# Patient Record
Sex: Female | Born: 1980 | Race: White | Hispanic: No | Marital: Married | Smoking: Former smoker
Health system: Southern US, Community
[De-identification: ages and names within clinical notes are randomized; demographics above are authoritative.]

## PROBLEM LIST (undated history)

## (undated) DIAGNOSIS — Z8619 Personal history of other infectious and parasitic diseases: Secondary | ICD-10-CM

## (undated) HISTORY — PX: LASER ABLATION OF THE CERVIX: SHX1949

---

## 1999-01-10 ENCOUNTER — Other Ambulatory Visit: Admission: RE | Admit: 1999-01-10 | Discharge: 1999-01-10 | Payer: Self-pay | Admitting: Obstetrics and Gynecology

## 2000-10-24 ENCOUNTER — Other Ambulatory Visit: Admission: RE | Admit: 2000-10-24 | Discharge: 2000-10-24 | Payer: Self-pay | Admitting: *Deleted

## 2001-05-07 ENCOUNTER — Emergency Department (HOSPITAL_COMMUNITY): Admission: EM | Admit: 2001-05-07 | Discharge: 2001-05-07 | Payer: Self-pay | Admitting: Podiatry

## 2001-05-07 ENCOUNTER — Encounter: Payer: Self-pay | Admitting: General Surgery

## 2002-11-20 ENCOUNTER — Other Ambulatory Visit: Admission: RE | Admit: 2002-11-20 | Discharge: 2002-11-20 | Payer: Self-pay | Admitting: Obstetrics and Gynecology

## 2003-12-02 ENCOUNTER — Other Ambulatory Visit: Admission: RE | Admit: 2003-12-02 | Discharge: 2003-12-02 | Payer: Self-pay | Admitting: Obstetrics and Gynecology

## 2004-02-22 ENCOUNTER — Ambulatory Visit (HOSPITAL_COMMUNITY): Admission: RE | Admit: 2004-02-22 | Discharge: 2004-02-22 | Payer: Self-pay | Admitting: Obstetrics and Gynecology

## 2004-02-22 ENCOUNTER — Encounter (INDEPENDENT_AMBULATORY_CARE_PROVIDER_SITE_OTHER): Payer: Self-pay | Admitting: Specialist

## 2007-03-06 ENCOUNTER — Ambulatory Visit: Payer: Self-pay | Admitting: Family Medicine

## 2008-07-07 ENCOUNTER — Ambulatory Visit: Payer: Self-pay | Admitting: Family Medicine

## 2009-11-07 ENCOUNTER — Ambulatory Visit: Payer: Self-pay | Admitting: Family Medicine

## 2011-03-23 NOTE — Op Note (Signed)
NAMEJUDYE, Katherine Bowman                            ACCOUNT NO.:  [id number]   MEDICAL RECORD NO.:  [id number]                   PATIENT TYPE:  AMB   LOCATION:  SDC                                  FACILITY:  [facility]   PHYSICIAN:  Sherry A. Rosalio Macadamia, M.D.           DATE OF BIRTH:  July 01, 1981   DATE OF PROCEDURE:  02/22/2004  DATE OF DISCHARGE:                                 OPERATIVE REPORT   PREOPERATIVE DIAGNOSIS:  Cervical dysplasia.   POSTOPERATIVE DIAGNOSIS:  Cervical dysplasia.   PROCEDURE:  Laser vaporization of cervix.   SURGEON:  Sherry A. Rosalio Macadamia, M.D.   ANESTHESIA:  MAC.   INDICATIONS:  This is a 30 year old G0, P0, woman who had an abnormal Pap  smear.  The patient underwent colposcopy and biopsy.  Biopsy revealed CIN-  II.  The abnormal areas were well out on the portio of the cervix;  therefore, the patient is brought to the operating room for laser  vaporization of cervix.   FINDINGS:  Cervical changes consistent with CIN-II.  No vulvar HPV.  Skin  tag on the right thigh.   PROCEDURE:  The patient was brought into the operating room and given  adequate IV sedation.  She was placed in the dorsal lithotomy position.  She  was draped with wet towels.  The vagina was washed with acetic acid.  Paracervical block was administered with 1% Nesacaine.  Using the colposcope  with the laser attached at 10 watts power, the abnormal areas were  identified and lasered circumferentially.  The cervix was divided into  quadrants.  The laser was increased to 40 watts power.  Each quadrant was  lasered at 40 watts power to a depth of 5-7 mm near the endocervical canal  and a depth of 3-4 mm on the portio.  This was done in each quadrant.  There  was no significant bleeding.  Using a defocused beam at 5 watts power, the  edges of the cervix were cauterized for a brush technique and the base was  cauterized in this fashion.  Monsel's solution was placed against the  cervix, adequate  hemostasis was present.  The external genitalia were then  observed after washing with acetic acid.  The colposcope was used.  There  were no HPV lesions seen, as had been seen in the office several weeks prior  to this procedure.  There was a probable skin tag on the right thigh.  This  was infiltrated with 1% Xylocaine with epinephrine and shave biopsy was  obtained.  Monsel's solution was placed on this shaved area and a Band-Aid  was placed.  The patient was taken out of the dorsal lithotomy position.  She was awakened.  She was moved from the operating table to a stretcher in  stable condition.   COMPLICATIONS:  None.   ESTIMATED BLOOD LOSS:  Less than 5 mL.   SPECIMENS:  Right thigh  skin tag.                                               Sherry A. Rosalio Macadamia, M.D.    SAD/MEDQ  D:  02/22/2004  T:  02/23/2004  Job:  [id number]

## 2011-03-23 NOTE — Consult Note (Signed)
[facility]  Patient:    Katherine Bowman, Katherine Bowman                         MRN: [id number] Proc. Date: 05/07/01 Adm. Date:  [id number] Disc. Date: [id number] Attending:  Carmelina Peal CC:         Dr. Vonita Moss, [facility]   Consultation Report  CHIEF COMPLAINT:  Vomiting and diarrhea.  HISTORY OF PRESENT ILLNESS:  This is a 30 year old white female who has had repeated bouts of vomiting and diarrhea today.  She has vomited at least 10 times and has had at least 10 loose watery stools without blood.  She denies abdominal pain.  She states she did have fever of 101 at home and did have some chills.  She has no prior gastrointestinal problems, according to her.  She went to [facility] with her mother and was thought to possibly have right lower quadrant tenderness and was sent to me to "rule out appendicitis."  When seen in the emergency room, she does not appear to be in any acute distress and denies that she is having any abdominal pain.  PAST MEDICAL HISTORY:  She denies having any surgery.  She denies any medical problems.  She was hospitalized at age 30 for a viral illness.  According to her mother, the patient has an alcohol abuse problem and has attempted to commit suicide once.  She also sees a Veterinary surgeon for her alcohol abuse.  The patient is very reluctant to talk about this.  CURRENT MEDICATIONS:  Birth control pills.  DRUG ALLERGIES:  None known.  SOCIAL HISTORY:  The patient lives with two roommates.  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  She is single and has no children.  She does smoke cigarettes and does admit to drinking alcohol.  FAMILY HISTORY:  Mother, age 26, father, age 29, both living and well.  One sister living and well.  One brother who has some type of bone disease.  REVIEW OF SYSTEMS:  Twelve-system review is negative except as described above.  PHYSICAL EXAMINATION:  GENERAL:   A thin, young white female in no distress.  VITAL SIGNS:  Temperature 98.3, pulse 88, respirations 20, blood pressure 153/78.  HEENT:  Sclerae clear.  Extraocular movements intact.  Oropharynx clear.  No lesions.  NECK:  Supple.  Nontender.  No adenopathy.  No thyromegaly.  No jugular venous distention.  LUNGS:  Clear to auscultation.  No CVA tenderness.  HEART:  Regular rate and rhythm.  No murmur.  BREASTS:  Not examined.  ABDOMEN:  Soft.  Scaphoid.  Active bowel sounds.  No tenderness, no mass, no hernia.  Basically, a completely benign exam.  EXTREMITIES:  No edema.  Good pulses.  NEUROLOGIC:  Grossly within normal limits.  DATA:  Urinalysis is negative except for specific gravity greater than 1.040. Basic metabolic panel is normal.  CBC shows a WBC of 8700 with 91 segs and 4 lymphs.  Hemoglobin 14.5.  Urine pregnancy test negative.  CT scan is completely normal.  There is no inflammatory process, no fluid collection.  Appendix is well-seen and filled with contrast.  Benign CT scan.  IMPRESSION: 1. Abdominal pain, suspect viral gastroenteritis. 2. History of alcohol abuse. 3. History of tobacco abuse.  PLAN:  The patient will be given IV fluid rehydration and she will be discharged home on a liquid diet.  She has been advised  to stop all alcohol intake and she has been advised to stop smoking.  She will return to the emergency room if she has any recurrent symptoms, otherwise, see me p.r.n. DD:  05/07/01 TD:  05/08/01 Job: [id number]

## 2011-05-14 ENCOUNTER — Other Ambulatory Visit: Payer: Self-pay | Admitting: Obstetrics and Gynecology

## 2012-03-02 ENCOUNTER — Encounter (HOSPITAL_COMMUNITY)
Admission: AD | Disposition: A | Discharge status: Home / Self Care | Payer: Self-pay | Source: Ambulatory Visit | Attending: Obstetrics and Gynecology

## 2012-03-02 ENCOUNTER — Encounter (HOSPITAL_COMMUNITY): Payer: Self-pay | Admitting: *Deleted

## 2012-03-02 ENCOUNTER — Inpatient Hospital Stay (HOSPITAL_COMMUNITY): Payer: BC Managed Care – PPO | Admitting: Anesthesiology

## 2012-03-02 ENCOUNTER — Inpatient Hospital Stay (HOSPITAL_COMMUNITY)
Admission: AD | Admit: 2012-03-02 | Discharge: 2012-03-05 | DRG: 371 | Disposition: A | Discharge status: Home / Self Care | Payer: BC Managed Care – PPO | Source: Ambulatory Visit | Attending: Obstetrics and Gynecology | Admitting: Obstetrics and Gynecology

## 2012-03-02 ENCOUNTER — Encounter (HOSPITAL_COMMUNITY): Payer: Self-pay | Admitting: Anesthesiology

## 2012-03-02 DIAGNOSIS — O30049 Twin pregnancy, dichorionic/diamniotic, unspecified trimester: Secondary | ICD-10-CM

## 2012-03-02 DIAGNOSIS — O30009 Twin pregnancy, unspecified number of placenta and unspecified number of amniotic sacs, unspecified trimester: Secondary | ICD-10-CM | POA: Diagnosis present

## 2012-03-02 DIAGNOSIS — O3090 Multiple gestation, unspecified, unspecified trimester: Principal | ICD-10-CM | POA: Diagnosis present

## 2012-03-02 HISTORY — DX: Personal history of other infectious and parasitic diseases: Z86.19

## 2012-03-02 LAB — CBC
HCT: 40.2 % (ref 36.0–46.0)
Hemoglobin: 13.4 g/dL (ref 12.0–15.0)
MCV: 92.2 fL (ref 78.0–100.0)
RDW: 13.7 % (ref 11.5–15.5)
WBC: 11.7 10*3/uL — ABNORMAL HIGH (ref 4.0–10.5)

## 2012-03-02 SURGERY — Surgical Case
Anesthesia: Spinal

## 2012-03-02 MED ORDER — CEFAZOLIN SODIUM 1-5 GM-% IV SOLN: 1.0000 g | INTRAVENOUS | Status: DC

## 2012-03-02 MED ORDER — CITRIC ACID-SODIUM CITRATE 334-500 MG/5ML PO SOLN
ORAL | Status: AC
  Administered 2012-03-02: 30 mL via ORAL
  Filled 2012-03-02: qty 15

## 2012-03-02 MED ORDER — FAMOTIDINE IN NACL 20-0.9 MG/50ML-% IV SOLN
INTRAVENOUS | Status: AC
  Filled 2012-03-02: qty 50

## 2012-03-02 MED ORDER — EPHEDRINE SULFATE 50 MG/ML IJ SOLN
INTRAMUSCULAR | Status: DC | PRN
  Administered 2012-03-02 (×3): 10 mg via INTRAVENOUS

## 2012-03-02 MED ORDER — FENTANYL CITRATE 0.05 MG/ML IJ SOLN
INTRAMUSCULAR | Status: DC | PRN
  Administered 2012-03-02: 25 ug via INTRATHECAL

## 2012-03-02 MED ORDER — MEPERIDINE HCL 25 MG/ML IJ SOLN: 6.2500 mg | INTRAMUSCULAR | Status: DC | PRN

## 2012-03-02 MED ORDER — FAMOTIDINE IN NACL 20-0.9 MG/50ML-% IV SOLN: 20.0000 mg | Freq: Once | INTRAVENOUS | Status: DC

## 2012-03-02 MED ORDER — KETOROLAC TROMETHAMINE 30 MG/ML IJ SOLN
INTRAMUSCULAR | Status: AC
  Administered 2012-03-02: 30 mg via INTRAVENOUS
  Filled 2012-03-02: qty 1

## 2012-03-02 MED ORDER — CEFAZOLIN SODIUM 1-5 GM-% IV SOLN
INTRAVENOUS | Status: AC
  Filled 2012-03-02: qty 50

## 2012-03-02 MED ORDER — ACETAMINOPHEN 325 MG PO TABS: 325.0000 mg | ORAL_TABLET | ORAL | Status: DC | PRN

## 2012-03-02 MED ORDER — OXYTOCIN 10 UNIT/ML IJ SOLN
20.0000 [IU] | INTRAVENOUS | Status: DC | PRN
  Administered 2012-03-02: 20 [IU] via INTRAVENOUS

## 2012-03-02 MED ORDER — CITRIC ACID-SODIUM CITRATE 334-500 MG/5ML PO SOLN
30.0000 mL | Freq: Once | ORAL | Status: AC
  Administered 2012-03-02: 30 mL via ORAL

## 2012-03-02 MED ORDER — LACTATED RINGERS IV BOLUS (SEPSIS): 1000.0000 mL | Freq: Once | INTRAVENOUS | Status: DC

## 2012-03-02 MED ORDER — MORPHINE SULFATE (PF) 0.5 MG/ML IJ SOLN
INTRAMUSCULAR | Status: DC | PRN
  Administered 2012-03-02: .1 mg via INTRATHECAL

## 2012-03-02 MED ORDER — ONDANSETRON HCL 4 MG/2ML IJ SOLN
INTRAMUSCULAR | Status: DC | PRN
  Administered 2012-03-02: 4 mg via INTRAVENOUS

## 2012-03-02 MED ORDER — MIDAZOLAM HCL 2 MG/2ML IJ SOLN: 0.5000 mg | Freq: Once | INTRAMUSCULAR | Status: DC | PRN

## 2012-03-02 MED ORDER — SCOPOLAMINE 1 MG/3DAYS TD PT72
MEDICATED_PATCH | TRANSDERMAL | Status: AC
  Filled 2012-03-02: qty 1

## 2012-03-02 MED ORDER — FENTANYL CITRATE 0.05 MG/ML IJ SOLN: 25.0000 ug | INTRAMUSCULAR | Status: DC | PRN

## 2012-03-02 MED ORDER — KETOROLAC TROMETHAMINE 30 MG/ML IJ SOLN: 30.0000 mg | Freq: Four times a day (QID) | INTRAMUSCULAR | Status: AC | PRN

## 2012-03-02 MED ORDER — PROMETHAZINE HCL 25 MG/ML IJ SOLN: 6.2500 mg | INTRAMUSCULAR | Status: DC | PRN

## 2012-03-02 MED ORDER — LACTATED RINGERS IV SOLN
INTRAVENOUS | Status: DC | PRN
  Administered 2012-03-02 (×2): via INTRAVENOUS

## 2012-03-02 MED ORDER — PHENYLEPHRINE HCL 10 MG/ML IJ SOLN
INTRAMUSCULAR | Status: DC | PRN
  Administered 2012-03-02: 80 ug via INTRAVENOUS
  Administered 2012-03-02: 120 ug via INTRAVENOUS

## 2012-03-02 MED ORDER — BUPIVACAINE IN DEXTROSE 0.75-8.25 % IT SOLN
INTRATHECAL | Status: DC | PRN
  Administered 2012-03-02: 1.5 mL via INTRATHECAL

## 2012-03-02 MED ORDER — SCOPOLAMINE 1 MG/3DAYS TD PT72
1.0000 | MEDICATED_PATCH | Freq: Once | TRANSDERMAL | Status: DC
  Administered 2012-03-02: 1.5 mg via TRANSDERMAL

## 2012-03-02 MED ORDER — BUPIVACAINE HCL (PF) 0.25 % IJ SOLN
INTRAMUSCULAR | Status: DC | PRN
  Administered 2012-03-02: 10 mL

## 2012-03-02 MED ORDER — KETOROLAC TROMETHAMINE 30 MG/ML IJ SOLN
30.0000 mg | Freq: Four times a day (QID) | INTRAMUSCULAR | Status: AC | PRN
  Administered 2012-03-02: 30 mg via INTRAVENOUS

## 2012-03-02 SURGICAL SUPPLY — 36 items
CLOTH BEACON ORANGE TIMEOUT ST (SAFETY) ×2 IMPLANT
CONTAINER PREFILL 10% NBF 15ML (MISCELLANEOUS) IMPLANT
DRESSING TELFA 8X3 (GAUZE/BANDAGES/DRESSINGS) IMPLANT
DRSG COVADERM 4X10 (GAUZE/BANDAGES/DRESSINGS) ×1 IMPLANT
ELECT REM PT RETURN 9FT ADLT (ELECTROSURGICAL) ×2
ELECTRODE REM PT RTRN 9FT ADLT (ELECTROSURGICAL) ×1 IMPLANT
EXTRACTOR VACUUM M CUP 4 TUBE (SUCTIONS) IMPLANT
GAUZE SPONGE 4X4 12PLY STRL LF (GAUZE/BANDAGES/DRESSINGS) ×4 IMPLANT
GLOVE BIO SURGEON STRL SZ 6.5 (GLOVE) ×1 IMPLANT
GLOVE BIO SURGEON STRL SZ7.5 (GLOVE) ×4 IMPLANT
GLOVE BIOGEL PI IND STRL 7.5 (GLOVE) IMPLANT
GLOVE BIOGEL PI INDICATOR 7.5 (GLOVE) ×2
GLOVE SS N UNI LF 7.5 STRL (GLOVE) ×1 IMPLANT
GLOVE SURG SS PI 7.5 STRL IVOR (GLOVE) ×1 IMPLANT
GOWN PREVENTION PLUS LG XLONG (DISPOSABLE) ×5 IMPLANT
GOWN PREVENTION PLUS XLARGE (GOWN DISPOSABLE) ×2 IMPLANT
KIT ABG SYR 3ML LUER SLIP (SYRINGE) IMPLANT
NDL HYPO 25X1 1.5 SAFETY (NEEDLE) ×1 IMPLANT
NDL HYPO 25X5/8 SAFETYGLIDE (NEEDLE) IMPLANT
NEEDLE HYPO 25X1 1.5 SAFETY (NEEDLE) ×2 IMPLANT
NEEDLE HYPO 25X5/8 SAFETYGLIDE (NEEDLE) ×2 IMPLANT
NS IRRIG 1000ML POUR BTL (IV SOLUTION) ×2 IMPLANT
PACK C SECTION WH (CUSTOM PROCEDURE TRAY) ×2 IMPLANT
PAD ABD 7.5X8 STRL (GAUZE/BANDAGES/DRESSINGS) IMPLANT
SLEEVE SCD COMPRESS KNEE MED (MISCELLANEOUS) ×1 IMPLANT
STAPLER VISISTAT 35W (STAPLE) ×2 IMPLANT
SUT MNCRL 0 VIOLET CTX 36 (SUTURE) ×2 IMPLANT
SUT MON AB 2-0 CT1 27 (SUTURE) ×2 IMPLANT
SUT MON AB-0 CT1 36 (SUTURE) ×4 IMPLANT
SUT MONOCRYL 0 CTX 36 (SUTURE) ×2
SUT PLAIN 0 NONE (SUTURE) IMPLANT
SUT PLAIN 2 0 XLH (SUTURE) IMPLANT
SYR CONTROL 10ML LL (SYRINGE) ×2 IMPLANT
TOWEL OR 17X24 6PK STRL BLUE (TOWEL DISPOSABLE) ×4 IMPLANT
TRAY FOLEY CATH 14FR (SET/KITS/TRAYS/PACK) ×2 IMPLANT
WATER STERILE IRR 1000ML POUR (IV SOLUTION) ×2 IMPLANT

## 2012-03-02 NOTE — Anesthesia Preprocedure Evaluation (Signed)
Anesthesia Evaluation  Patient identified by MRN, date of birth, ID band Patient awake    Reviewed: Allergy & Precautions, H&P , NPO status , Patient's Chart, lab work & pertinent test results  Airway Mallampati: II      Dental No notable dental hx.    Pulmonary neg pulmonary ROS,  breath sounds clear to auscultation  Pulmonary exam normal       Cardiovascular Exercise Tolerance: Good negative cardio ROS  Rhythm:regular Rate:Normal     Neuro/Psych negative neurological ROS  negative psych ROS   GI/Hepatic negative GI ROS, Neg liver ROS,   Endo/Other  negative endocrine ROS  Renal/GU negative Renal ROS  negative genitourinary   Musculoskeletal   Abdominal Normal abdominal exam  (+)   Peds  Hematology negative hematology ROS (+)   Anesthesia Other Findings   Reproductive/Obstetrics (+) Pregnancy                           Anesthesia Physical Anesthesia Plan  ASA: II and Emergent  Anesthesia Plan: Spinal   Post-op Pain Management:    Induction:   Airway Management Planned:   Additional Equipment:   Intra-op Plan:   Post-operative Plan:   Informed Consent: I have reviewed the patients History and Physical, chart, labs and discussed the procedure including the risks, benefits and alternatives for the proposed anesthesia with the patient or authorized representative who has indicated his/her understanding and acceptance.     Plan Discussed with: Anesthesiologist, CRNA and Surgeon  Anesthesia Plan Comments:         Anesthesia Quick Evaluation  

## 2012-03-02 NOTE — Anesthesia Procedure Notes (Addendum)
Spinal  Patient location during procedure: OR Start time: 03/02/2012 7:37 PM Staffing Anesthesiologist: Brayton Caves R Performed by: anesthesiologist  Preanesthetic Checklist Completed: patient identified, site marked, surgical consent, pre-op evaluation, timeout performed, IV checked, risks and benefits discussed and monitors and equipment checked Spinal Block Patient position: sitting Prep: DuraPrep Patient monitoring: heart rate, cardiac monitor, continuous pulse ox and blood pressure Approach: midline Location: L3-4 Injection technique: single-shot Needle Needle type: Sprotte  Needle gauge: 24 G Needle length: 9 cm Assessment Sensory level: T4 Additional Notes Patient identified.  Risk benefits discussed including failed block, incomplete pain control, headache, nerve damage, paralysis, blood pressure changes, nausea, vomiting, reactions to medication both toxic or allergic, and postpartum back pain.  Patient expressed understanding and wished to proceed.  All questions were answered.  Sterile technique used throughout procedure.  CSF was clear.  No parasthesia or other complications.  Please see nursing notes for vital signs.

## 2012-03-02 NOTE — H&P (Signed)
Katherine Bowman, Katherine Bowman                ACCOUNT NO.:  [id number]  MEDICAL RECORD NO.:  [id number]  LOCATION:  WU98                          FACILITY:  [facility]  PHYSICIAN:  Lenoard Aden, M.D.DATE OF BIRTH:  09/16/1981  DATE OF ADMISSION:  03/02/2012 DATE OF DISCHARGE:                             HISTORY & PHYSICAL   CHIEF COMPLAINT:  Spontaneous rupture of membranes.  HISTORY OF PRESENT ILLNESS:  She is a 31 year old white female, G1, P0, at 33-5/7th weeks' gestation who presents with spontaneous rupture of membranes, malpresentation of twin B, and advanced dilatation for delivery.  She has a medical history remarkable for no known drug allergies.  Medications are prenatal vitamins.  She is a nonsmoker, nondrinker.  She denies domestic or physical violence.  She has a previous history of an abnormal Pap smear with a history of a LEEP.  She has a family history of hypertension, mental retardation.  There are no previous pregnancies.  She has a history of laser surgery to her cervix.  Prenatal course was complicated by advanced preterm cervical change and malpresentation of B.  The patient is status post betamethasone.  PHYSICAL EXAMINATION:  GENERAL:  She is a well-developed, well- nourished, white female, in no acute distress. HEENT:  Normal. NECK:  Supple.  Full range of motion. LUNGS:  Clear. HEART:  Regular, ABDOMEN:  Soft, gravid, nontender. BACK:  No CVA tenderness. EXTREMITIES:  No cords. NEUROLOGIC:  Nonfocal. SKIN:  Intact.  IMPRESSION: 1. A 33-5/7th week twin dichorionic diamniotic gestation with     spontaneous rupture of membranes, Twin A. 2. Advanced dilatation with progressive cervical dilatation to 6-7 cm     noted. 3. Malpresentation of twin B.  The patient declines trial of labor     with possibilities for risks of twin B.  Plan is to proceed with primary low segment transverse cesarean section. Risks of anesthesia, infection, bleeding, injury to  abdominal organs, need for repair were discussed.  Delayed versus immediate complications include bowel, bladder injury were noted.  The patient acknowledges and wishes to proceed.  OR was notified, NICU notified.     Lenoard Aden, M.D.     RJT/MEDQ  D:  03/02/2012  T:  03/02/2012  Job:  [id number]

## 2012-03-02 NOTE — MAU Provider Note (Signed)
  History   SROM ago  CSN: [id number]  Arrival date and time: 03/02/12 4098   First Provider Initiated Contact with Patient 03/02/12 1903      Chief Complaint  Patient presents with  . Rupture of Membranes   HPI dictated OB History    Grav Para Term Preterm Abortions TAB SAB Ect Mult Living   1               Past Medical History  Diagnosis Date  . No pertinent past medical history     Past Surgical History  Procedure Date  . No past surgeries     No family history on file.  History  Substance Use Topics  . Smoking status: Not on file  . Smokeless tobacco: Not on file  . Alcohol Use: Not on file    Allergies: No Known Allergies  No prescriptions prior to admission    ROS Physical Exam dictated   There were no vitals taken for this visit.  Physical Exam  MAU Course  Procedures  MDM na  Assessment and Plan  33 5/7 weeks DIDI twins with SROM and advanced dilatation and malpresentation B. Proceed with Cesarean section Consent signed and risks vs benefits discussed.  Davionne Mastrangelo J 03/02/2012, 7:11 PM

## 2012-03-02 NOTE — Anesthesia Postprocedure Evaluation (Signed)
Anesthesia Post Note  Patient: Katherine Bowman  Procedure(s) Performed: Procedure(s) (LRB): CESAREAN SECTION (N/A)  Anesthesia type: Spinal  Patient location: PACU  Post pain: Pain level controlled  Post assessment: Post-op Vital signs reviewed  Last Vitals:  Filed Vitals:   03/02/12 2115  BP: 142/78  Pulse: 65  Temp: 36.3 C  Resp: 16    Post vital signs: Reviewed  Level of consciousness: awake  Complications: No apparent anesthesia complications

## 2012-03-02 NOTE — MAU Note (Signed)
Pt reports ROM @ 1830

## 2012-03-02 NOTE — Transfer of Care (Signed)
Immediate Anesthesia Transfer of Care Note  Patient: Katherine Bowman  Procedure(s) Performed: Procedure(s) (LRB): CESAREAN SECTION (N/A)  Patient Location: PACU  Anesthesia Type: Spinal  Level of Consciousness: awake, alert  and oriented  Airway & Oxygen Therapy: Patient Spontanous Breathing  Post-op Assessment: Report given to PACU RN and Post -op Vital signs reviewed and stable  Post vital signs: stable  Complications: No apparent anesthesia complications

## 2012-03-02 NOTE — Op Note (Signed)
Cesarean Section Procedure Note  Indications: DIDI twins with advanced dilatation, SROM, and malpresentation of Twin B.  Pre-operative Diagnosis: 33 week 5 day pregnancy.  Post-operative Diagnosis: same  Surgeon: Lenoard Aden   Assistants: Seymour Bars  Anesthesia: Local anesthesia 0.25.% bupivacaine and General Endotracheal with Epidural Regional  ASA Class: 2  Procedure Details  The patient was seen in the Holding Room. The risks, benefits, complications, treatment options, and expected outcomes were discussed with the patient.  The patient concurred with the proposed plan, giving informed consent. The risks of anesthesia, infection, bleeding and possible injury to other organs discussed. Injury to bowel, bladder, or ureter with possible need for repair discussed. Possible need for transfusion with secondary risks of hepatitis or HIV acquisition discussed. Post operative complications to include but not limited to DVT, PE and Pneumonia noted. The site of surgery properly noted/marked. The patient was taken to Operating Room # 2, identified as Katherine Bowman and the procedure verified as C-Section Delivery. A Time Out was held and the above information confirmed.  After induction of anesthesia, the patient was draped and prepped in the usual sterile manner. A Pfannenstiel incision was made and carried down through the subcutaneous tissue to the fascia. Fascial incision was made and extended transversely using Mayo scissors. The fascia was separated from the underlying rectus tissue superiorly and inferiorly. The peritoneum was identified and entered. Peritoneal incision was extended longitudinally. The utero-vesical peritoneal reflection was incised transversely and the bladder flap was bluntly freed from the lower uterine segment. A low transverse uterine incision(Kerr hysterotomy) was made. Delivered from vtx presentation was a  Female Twin A with Apgar scores of 8 at one minute and 10 at five  minutes. AROM clear for twin B converted to VTX position from transverse back up lie and delivered with apgars 8 and 9.  Bulb suctioning gently performed on both twins. Neonatal team in attendance.After the umbilical cords were clamped and cut, cord blood was obtained for evaluation. The placentas were removed intact and appeared normal. The uterus was curetted with a dry lap pack. Good hemostasis was noted.The uterine outline, tubes and ovaries appeared normal. The uterine incision was closed with running locked sutures of 0 Monocryl x 2 layers. Hemostasis was observed. Lavage was carried out until clear.The parietal peritoneum was closed with a running 2-0 Monocryl suture. The fascia was then reapproximated with running sutures of 0 Monocryl. The skin was reapproximated with staples.  Instrument, sponge, and needle counts were correct prior the abdominal closure and at the conclusion of the case.   Findings: Placenta x 2 to path  Estimated Blood Loss:  600         Drains: Foley                 Specimens: above3                 Complications:  None; patient tolerated the procedure well.         Disposition: PACU - hemodynamically stable.         Condition: stable  Attending Attestation: I performed the procedure.

## 2012-03-03 ENCOUNTER — Encounter (HOSPITAL_COMMUNITY): Payer: Self-pay

## 2012-03-03 LAB — CBC
MCH: 31 pg (ref 26.0–34.0)
MCV: 93 fL (ref 78.0–100.0)
Platelets: 144 10*3/uL — ABNORMAL LOW (ref 150–400)
RBC: 3.74 MIL/uL — ABNORMAL LOW (ref 3.87–5.11)
RDW: 13.5 % (ref 11.5–15.5)
WBC: 14.2 10*3/uL — ABNORMAL HIGH (ref 4.0–10.5)

## 2012-03-03 LAB — ABO/RH: ABO/RH(D): O POS

## 2012-03-03 MED ORDER — PRENATAL MULTIVITAMIN CH
1.0000 | ORAL_TABLET | Freq: Every day | ORAL | Status: DC
  Administered 2012-03-03 – 2012-03-05 (×3): 1 via ORAL
  Filled 2012-03-03 (×3): qty 1

## 2012-03-03 MED ORDER — TETANUS-DIPHTH-ACELL PERTUSSIS 5-2.5-18.5 LF-MCG/0.5 IM SUSP
0.5000 mL | Freq: Once | INTRAMUSCULAR | Status: AC
  Administered 2012-03-03: 0.5 mL via INTRAMUSCULAR
  Filled 2012-03-03: qty 0.5

## 2012-03-03 MED ORDER — SIMETHICONE 80 MG PO CHEW
80.0000 mg | CHEWABLE_TABLET | Freq: Three times a day (TID) | ORAL | Status: DC
  Administered 2012-03-03 – 2012-03-04 (×5): 80 mg via ORAL

## 2012-03-03 MED ORDER — NALBUPHINE HCL 10 MG/ML IJ SOLN
5.0000 mg | INTRAMUSCULAR | Status: DC | PRN
  Filled 2012-03-03: qty 1

## 2012-03-03 MED ORDER — DIBUCAINE 1 % RE OINT: 1.0000 "application " | TOPICAL_OINTMENT | RECTAL | Status: DC | PRN

## 2012-03-03 MED ORDER — NALOXONE HCL 0.4 MG/ML IJ SOLN: 0.4000 mg | INTRAMUSCULAR | Status: DC | PRN

## 2012-03-03 MED ORDER — MENTHOL 3 MG MT LOZG: 1.0000 | LOZENGE | OROMUCOSAL | Status: DC | PRN

## 2012-03-03 MED ORDER — ONDANSETRON HCL 4 MG/2ML IJ SOLN: 4.0000 mg | Freq: Three times a day (TID) | INTRAMUSCULAR | Status: DC | PRN

## 2012-03-03 MED ORDER — ACETAMINOPHEN 10 MG/ML IV SOLN
1000.0000 mg | Freq: Four times a day (QID) | INTRAVENOUS | Status: AC | PRN
  Filled 2012-03-03: qty 100

## 2012-03-03 MED ORDER — IBUPROFEN 600 MG PO TABS
600.0000 mg | ORAL_TABLET | Freq: Four times a day (QID) | ORAL | Status: DC
  Administered 2012-03-03 – 2012-03-05 (×9): 600 mg via ORAL
  Filled 2012-03-03 (×10): qty 1

## 2012-03-03 MED ORDER — ONDANSETRON HCL 4 MG PO TABS: 4.0000 mg | ORAL_TABLET | ORAL | Status: DC | PRN

## 2012-03-03 MED ORDER — DIPHENHYDRAMINE HCL 25 MG PO CAPS: 25.0000 mg | ORAL_CAPSULE | ORAL | Status: DC | PRN

## 2012-03-03 MED ORDER — METOCLOPRAMIDE HCL 5 MG/ML IJ SOLN: 10.0000 mg | Freq: Three times a day (TID) | INTRAMUSCULAR | Status: DC | PRN

## 2012-03-03 MED ORDER — DIPHENHYDRAMINE HCL 50 MG/ML IJ SOLN: 25.0000 mg | INTRAMUSCULAR | Status: DC | PRN

## 2012-03-03 MED ORDER — METHYLERGONOVINE MALEATE 0.2 MG/ML IJ SOLN: 0.2000 mg | INTRAMUSCULAR | Status: DC | PRN

## 2012-03-03 MED ORDER — WITCH HAZEL-GLYCERIN EX PADS: 1.0000 "application " | MEDICATED_PAD | CUTANEOUS | Status: DC | PRN

## 2012-03-03 MED ORDER — OXYTOCIN 20 UNITS IN LACTATED RINGERS INFUSION - SIMPLE
125.0000 mL/h | INTRAVENOUS | Status: AC
  Filled 2012-03-03: qty 1000

## 2012-03-03 MED ORDER — ZOLPIDEM TARTRATE 5 MG PO TABS: 5.0000 mg | ORAL_TABLET | Freq: Every evening | ORAL | Status: DC | PRN

## 2012-03-03 MED ORDER — LANOLIN HYDROUS EX OINT: 1.0000 "application " | TOPICAL_OINTMENT | CUTANEOUS | Status: DC | PRN

## 2012-03-03 MED ORDER — METHYLERGONOVINE MALEATE 0.2 MG PO TABS: 0.2000 mg | ORAL_TABLET | ORAL | Status: DC | PRN

## 2012-03-03 MED ORDER — SENNOSIDES-DOCUSATE SODIUM 8.6-50 MG PO TABS
2.0000 | ORAL_TABLET | Freq: Every day | ORAL | Status: DC
  Administered 2012-03-03 – 2012-03-04 (×2): 2 via ORAL

## 2012-03-03 MED ORDER — SIMETHICONE 80 MG PO CHEW: 80.0000 mg | CHEWABLE_TABLET | ORAL | Status: DC | PRN

## 2012-03-03 MED ORDER — DIPHENHYDRAMINE HCL 50 MG/ML IJ SOLN: 12.5000 mg | INTRAMUSCULAR | Status: DC | PRN

## 2012-03-03 MED ORDER — ONDANSETRON HCL 4 MG/2ML IJ SOLN: 4.0000 mg | INTRAMUSCULAR | Status: DC | PRN

## 2012-03-03 MED ORDER — DIPHENHYDRAMINE HCL 25 MG PO CAPS: 25.0000 mg | ORAL_CAPSULE | Freq: Four times a day (QID) | ORAL | Status: DC | PRN

## 2012-03-03 MED ORDER — SODIUM CHLORIDE 0.9 % IJ SOLN: 3.0000 mL | INTRAMUSCULAR | Status: DC | PRN

## 2012-03-03 MED ORDER — SODIUM CHLORIDE 0.9 % IV SOLN
1.0000 ug/kg/h | INTRAVENOUS | Status: DC | PRN
  Filled 2012-03-03: qty 2.5

## 2012-03-03 MED ORDER — OXYCODONE-ACETAMINOPHEN 5-325 MG PO TABS
1.0000 | ORAL_TABLET | ORAL | Status: DC | PRN
  Administered 2012-03-03: 2 via ORAL
  Administered 2012-03-03 (×2): 1 via ORAL
  Administered 2012-03-03: 2 via ORAL
  Administered 2012-03-03: 1 via ORAL
  Administered 2012-03-04 (×5): 2 via ORAL
  Administered 2012-03-05: 1 via ORAL
  Administered 2012-03-05: 2 via ORAL
  Filled 2012-03-03: qty 1
  Filled 2012-03-03 (×7): qty 2
  Filled 2012-03-03: qty 1
  Filled 2012-03-03 (×3): qty 2
  Filled 2012-03-03: qty 1

## 2012-03-03 MED ORDER — LACTATED RINGERS IV SOLN
INTRAVENOUS | Status: DC
  Administered 2012-03-03: 02:00:00 via INTRAVENOUS

## 2012-03-03 NOTE — Progress Notes (Signed)
Report received from night shift RN. Pt has ambulated to NICU during early morning hours with no complications. Pt alert, oriented, vaginal bleeding moderate, pt resting rates pain at 4/10 requesting percocet. No other needs expressed

## 2012-03-03 NOTE — Anesthesia Postprocedure Evaluation (Signed)
  Anesthesia Post-op Note  Patient: Katherine Bowman  Procedure(s) Performed: Procedure(s) (LRB): CESAREAN SECTION (N/A)  Patient Location: PACU and A-ICU  Anesthesia Type: Spinal  Level of Consciousness: awake  Airway and Oxygen Therapy: Patient Spontanous Breathing  Post-op Pain: none  Post-op Assessment: Patient's Cardiovascular Status Stable, Respiratory Function Stable, Patent Airway, No signs of Nausea or vomiting, Adequate PO intake, Pain level controlled, No headache and No backache  Post-op Vital Signs: Reviewed and stable  Complications: No apparent anesthesia complications

## 2012-03-03 NOTE — Progress Notes (Signed)
Pt and family were in good spirits and reported that the babies are doing well.  Pt has good family support and although it is difficult that the babies are in the NICU, they are coping with it well at this time.  I offered compassionate listening and pastoral presence.    Please page as needed.  [phone]  Katherine Bowman 11:02 AM   03/03/12 1100  Clinical Encounter Type  Visited With Patient and family together  Visit Type Initial  Referral From Nurse  Spiritual Encounters  Spiritual Needs (Babies in NICU)

## 2012-03-03 NOTE — Progress Notes (Signed)
Subjective: POD# 1 Information for the patient's newborn:  Charla, Criscione [[id number]]  female Information for the patient's newborn:  Vernadine, Coombs [[id number]]  female  / circ planned Infants stable in NICU  Reports feeling well, ambulated to NICU this AM Feeding: breast / started pumping Patient reports tolerating PO.  Breast symptoms: none Pain controlled with Motrin and Percocet Denies HA/SOB/C/P/N/V/dizziness. Flatus absent. She reports vaginal bleeding as normal, without clots.  Foley cath still in place.     Objective:   VS:  Filed Vitals:   03/03/12 0330 03/03/12 0430 03/03/12 0534 03/03/12 0627  BP: 132/69  118/76   Pulse: 67  60   Temp:   98.3 F (36.8 C)   TempSrc:   Oral   Resp: 16  18   Height:      Weight:      SpO2: 97% 98% 97% 98%     Intake/Output Summary (Last 24 hours) at 03/03/12 0834 Last data filed at 03/03/12 0530  Gross per 24 hour  Intake   2960 ml  Output   1850 ml  Net   1110 ml        Basename 03/03/12 0525 03/02/12 1915  WBC 14.2* 11.7*  HGB 11.6* 13.4  HCT 34.8* 40.2  PLT 144* 194     Blood type: --/--/O POS (04/29 0525)  Rubella:   immune    Physical Exam:  General: alert, cooperative and no distress CV: Regular rate and rhythm Resp: clear Abdomen: soft, nontender, hypoactive bowel sounds Incision: clean, dry, intact and Dressing to LST Uterine Fundus: firm, below umbilicus, nontender Lochia: minimal Ext: Homans sign is negative, no sign of DVT and no edema, redness or tenderness in the calves or thighs      Assessment/Plan: 31 y.o.   POD# 1.  s/p Cesarean Delivery.             Principal Problem:  *PP care - s/ 1C/S 4/28 (twins PPROM 33.6 wks)  Doing well, stable.               Advance diet as tolerated, warm PO fluids D/C foley , shower and DC dressing this PM Ambulate Routine post-op care Lactation support for twins / NICU admit  Ovella Manygoats 03/03/2012, 8:34 AM

## 2012-03-03 NOTE — Progress Notes (Signed)
UR chart review completed.  

## 2012-03-03 NOTE — Addendum Note (Signed)
Addendum  created 03/03/12 0945 by Suella Grove, CRNA   Modules edited:Notes Section

## 2012-03-04 ENCOUNTER — Encounter (HOSPITAL_COMMUNITY): Payer: Self-pay | Admitting: Obstetrics and Gynecology

## 2012-03-04 NOTE — Progress Notes (Signed)
POSTOPERATIVE DAY # 2 S/P cesarean section - 34 week twins  Visited x 3 this am - patient out of room all times in NICU Exam and visit at 1330  S:         Reports feeling well/ sore and bloated             Tolerating po intake / no nausea / no vomiting / + flatus / No BM             Bleeding is spotting             Pain controlled with motrin and percocet             Up ad lib / ambulatory / visiting in NICU this am  Newborns stable in NICU - pumping / breastfeeding  / Circumcision planned              (female / female)   O:  A & O x 3             VS: Blood pressure 118/79, pulse 87, temperature 97.9 F (36.6 C), temperature source Oral, resp. rate 19, height 5' 1.5" (1.562 m), weight 70.308 kg (155 lb), SpO2 99.00%.  Lungs: Clear and unlabored  Heart: regular rate and rhythm / no mumurs  Abdomen: soft, non-tender, non-distended             Fundus: firm, non-tender, U-1             Dressing OFF              Incision:  approximated with staples / no erythema / no ecchymosis / no drainage  Perineum: no edema  Lochia: scant  Extremities: trace edema, no calf pain or tenderness  A:        POD # 2 S/P cesarean section - twins at 34 wks            Stable status  P:        Routine postoperative care              Lactation support as needed             Continue ambulation ad lib             Anticipate discharge tomorrow - ok to remain after discharge for feedings in NICU unless hospital high census      Edwards 03/04/2012, 9:18 AM

## 2012-03-04 NOTE — Progress Notes (Signed)
03/04/12 1400  Clinical Encounter Type  Visited With Patient and family together (Husband)  Visit Type Follow-up  Referral From Nurse  Spiritual Encounters  Spiritual Needs Emotional    Pt and husband very upbeat, reporting good support from family and friends, and overall feeling very grateful.    Made visit to connect prior to [facility] discharge to offer support now and through NICU stays.  Offered pastoral presence, encouragement, affirmation.  Avis Epley, [place] Chaplain [phone]

## 2012-03-05 ENCOUNTER — Encounter (HOSPITAL_COMMUNITY): Payer: Self-pay | Admitting: *Deleted

## 2012-03-05 MED ORDER — OXYCODONE-ACETAMINOPHEN 5-325 MG PO TABS: 1.0000 | ORAL_TABLET | ORAL | Status: AC | PRN

## 2012-03-05 MED ORDER — IBUPROFEN 600 MG PO TABS: 600.0000 mg | ORAL_TABLET | Freq: Four times a day (QID) | ORAL | Status: AC

## 2012-03-05 MED ORDER — BREAST PUMP MISC: 1.0000 [IU] | Status: DC | PRN

## 2012-03-05 NOTE — Discharge Summary (Signed)
POSTOPERATIVE DISCHARGE SUMMARY:  Patient ID: Katherine Bowman MRN: [id number] DOB/AGE: 01/23/1981 31 y.o.  Admit date: 03/02/2012 Discharge date:  03/05/2012   Admission Diagnoses:  1. 33-5/7th week twin dichorionic diamniotic gestation with  spontaneous rupture of membranes, Twin A.  2. Advanced dilatation with progressive cervical dilatation to 6-7 cm.  3. Malpresentation of twin B.  Discharge Diagnoses:   1. preterm pregnancy delivered 2. Post-op day 3, stable, status post primary cesarean section  Prenatal history: G1P0102   EDC : 04/15/2012, by Other Basis  Prenatal care at [facility] since [redacted] weeks gestation  Prenatal course complicated by twin gestation, advanced preterm  cervical change and malpresentation of twin B.  Prenatal Labs: ABO, Rh:   O pos Antibody:  neg Rubella:   immune RPR:   neg HBsAg:   neg HIV:   neg GBS:   unknown 1 hr Glucola : normal   Medical / Surgical History :  Past medical history:  Past Medical History  Diagnosis Date  . History of HPV infection   . PP care - s/ 1C/S 4/28 (twins PPROM 33.6 wks) 03/03/2012    Past surgical history:  Past Surgical History  Procedure Date  . No past surgeries   . Laser ablation of the cervix   . Cesarean section 03/02/2012    Procedure: CESAREAN SECTION;  Surgeon: Lenoard Aden, MD;  Location: [facility];  Service: Gynecology;  Laterality: N/A;    Family History:  Family History  Problem Relation Age of Onset  . Anesthesia problems Neg Hx     Social History:  reports that she has never smoked. She does not have any smokeless tobacco history on file. She reports that she does not drink alcohol or use illicit drugs.   Allergies: Review of patient's allergies indicates no known allergies.    Current Medications at time of admission:  Prescriptions prior to admission  Medication Sig Dispense Refill  . omeprazole (PRILOSEC) 20 MG capsule Take 20 mg by mouth daily.      . Prenatal  Vit-Fe Fumarate-FA (PRENATAL MULTIVITAMIN) TABS Take 1 tablet by mouth daily.      Marland Kitchen DISCONTD: amoxicillin (AMOXIL) 500 MG capsule Take 500 mg by mouth 2 (two) times daily.      Marland Kitchen DISCONTD: Calcium Carbonate Antacid (TUMS PO) Take 1 tablet by mouth daily.            Intrapartum Course:  Patient presented to hospital with premature rupture of membranes of twin A and advanced dilation (6-7 cm). Twin B with malposition, patient was counseled in regards to risks of vaginal birth versus cesarean section, patient opted for surgical option.  Procedures: Cesarean section delivery of female and female newborns by Dr Billy Coast.  See operative report for further details  Postoperative / postpartum course: uneventful. Twins remain in NICU in stable condition.  Physical Exam:  VSS: Blood pressure 123/83, pulse 82, temperature 97.2 F (36.2 C), temperature source Oral, resp. rate 18, height 5' 1.5" (1.562 m), weight 70.308 kg (155 lb), SpO2 99.00%, unknown if currently breastfeeding.   LABS:  Basename 03/03/12 0525 03/02/12 1915  WBC 14.2* 11.7*  HGB 11.6* 13.4  HCT 34.8* 40.2  PLT 144* 194    I&O: I/O last 3 completed shifts: In: -  Out: 1000 [Urine:1000]      Incision:  approximated with staples / no erythema / no ecchymosis / no drainage Staples:  removed prior to discharge and replaced with benzoin and steri strips.  Discharge Instructions:  Discharged Condition: good Activity: pelvic rest and weight lifting and driving restrictions x 2 weeks Diet: routine Medications:  Medication List  As of 03/05/2012  9:20 AM   STOP taking these medications         amoxicillin 500 MG capsule      TUMS PO         TAKE these medications         ibuprofen 600 MG tablet   Commonly known as: ADVIL,MOTRIN   Take 1 tablet (600 mg total) by mouth every 6 (six) hours.      omeprazole 20 MG capsule   Commonly known as: PRILOSEC   Take 20 mg by mouth daily.      oxyCODONE-acetaminophen 5-325 MG  per tablet   Commonly known as: PERCOCET   Take 1-2 tablets by mouth every 3 (three) hours as needed (moderate - severe pain).      prenatal multivitamin Tabs   Take 1 tablet by mouth daily.           Condition: stable Postpartum Instructions: refer to practice specific booklet Discharge to: home Disposition: Final discharge disposition not confirmed Follow up :  Follow-up Information    Follow up with Lenoard Aden, MD. Schedule an appointment as soon as possible for a visit in 6 weeks.   Contact information:   [address] [place] [address] [phone]           Signed: Arlan Organ 03/05/2012, 9:20 AM

## 2012-03-05 NOTE — Discharge Instructions (Signed)
Breast Pumping Tips Pumping your breast milk is a good way to stimulate milk production and have a steady supply of breast milk for your infant. Pumping is most helpful during your infant's growth spurts, when involving dad or a family member, or when you are away. There are several types of pumps available. They can be purchased at a baby or maternity store. You can begin pumping soon after delivery, but some experts believe that you should wait about four weeks to give your infant a bottle. In general, the more you breastfeed or pump, the more milk you will have for your infant. It is also important to take good care of yourself. This will reduce stress and help your body to create a healthy supply of milk. Your caregiver or lactation consultant can give you the information and support you need in your efforts to breastfeed your infant. PUMPING BREAST MILK  Follow the tips below for successful breast pumping. Take care of yourself.  Drink enough water or fluids to keep urine clear or pale yellow. You may notice a thirsty feeling while breastfeeding. This is because your body needs more water to make breast milk. Keep a large water bottle handy. Make healthy drink choices such as unsweetened fruit juice, milk and water. Limit soda, coffee, and alcohol (wait 2 hours to feed or pump if you have an alcoholic drink.)   Eat a healthy, well-balanced diet rich in fruits, vegetables, and whole grains.   Exercise as recommended by your caregiver.   Get plenty of sleep. Sleep when your infant sleeps. Ask friends and family for help if you need time to nap or rest.   Do not smoke. Smoking can lower your milk supply and harm your infant. If you need help quitting, ask your caregiver for a program recommendation.   Ask your caregiver about birth control options. Birth control pills may lower your milk supply. You may be advised to use condoms or other forms of birth control.  Relax and pump Stimulating your  let-down reflex is the key to successful and effective pumping. This makes the milk in all parts of the breast flow more freely.   It is easier to pump breast milk (and breastfeed) while you are relaxed. Find techniques that work for you. Quiet private spaces, breast massage, soothing heat placed on the breast, music, and pictures or a tape recording of your infant may help you to relax and "let down" your milk. If you have difficulty with your let down, try smelling one of your infant's blankets or an item of clothing he or she has worn while you are pumping.   When pumping, place the special suction cup (flange) directly over the nipple. It may be uncomfortable and cause nipple damage if it is not placed properly or is the wrong size. Applying a small amount of purified or modified lanolin to your nipple and the areola may help increase your comfort level. Also, you can change the speed and suction of many electric pumps to your comfort level. Your caregiver or lactation consultant can help you with this.   If pumping continues to be painful, or you feel you are not getting very much milk when you pump, you may need a different type of pump. A lactation consultant can help you determine if this is the case.   If you are with your infant, feed him or her on demand and try pumping after each feeding. This will boost your production, even if milk   does not come out. You may not be able to pump much milk at first, but keep up the routine, and this will change.   If you are working or away from your infant for several hours, try pumping for about 15 minutes every 2 to 3 hours. Pump both breasts at the same time if you can.   If your infant has a formula feeding, make sure you pump your milk around the same time to maintain your supply.   Begin pumping breast milk a few weeks before you return to work. This will help you develop techniques that work for you and will be able to store extra milk.   Find a  source of breastfeeding information that works well for you.  TIPS FOR STORING BREAST MILK  Store breast milk in a sealable sterile bag, jar, or container provided with your pumping supplies.   Store milk in small amounts close to what your infant is drinking at each feeding.   Cool pumped milk in a refrigerator or cooler. Pumped milk can last at the back of the refrigerator for 3 to 8 days.   Place cooled milk at the back of the freezer for up to 3 months.   Thaw the milk in its container or bag in warm water up to 24 hours in advance. Do not use a microwave to thaw or heat milk. Do not refreeze the milk after it has been thawed.   Breast milk is safe to drink when left at room temperature (mid 70s or colder) for 4 to 8 hours. After that, throw it away.   Milk fat can separate and look funny. The color can vary slightly from day to day. This is normal. Always shake the milk before using it to mix the fat with the more watery portion.  SEEK MEDICAL CARE IF:   You are having trouble pumping or feeding your infant.   You are concerned that you are not making enough milk.   You have nipple pain, soreness, or redness.   You have other questions or concerns related to you or your infant.  Document Released: 04/11/2010 Document Revised: 10/11/2011 Document Reviewed: 04/11/2010 ExitCare Patient Information 2012 [employer].Postpartum Depression and Baby Blues The postpartum period begins right after the birth of a baby. During this time, there is often a great amount of joy and excitement. It is also a time of considerable changes in the life of the parent(s). Regardless of how many times a mother gives birth, each child brings new challenges and dynamics to the family. It is not unusual to have feelings of excitement accompanied by confusing shifts in moods, emotions, and thoughts. All mothers are at risk of developing postpartum depression or the "baby blues." These mood changes can occur  right after giving birth, or they may occur many months after giving birth. The baby blues or postpartum depression can be mild or severe. Additionally, postpartum depression can resolve rather quickly, or it can be a long-term condition. CAUSES Elevated hormones and their rapid decline are thought to be a main cause of postpartum depression and the baby blues. There are a number of hormones that radically change during and after pregnancy. Estrogen and progesterone usually decrease immediately after delivering your baby. The level of thyroid hormone and various cortisol steroids also rapidly drop. Other factors that play a major role in these changes include major life events and genetics.  RISK FACTORS If you have any of the following risks for the   baby blues or postpartum depression, know what symptoms to watch out for during the postpartum period. Risk factors that may increase the likelihood of getting the baby blues or postpartum depression include:  Havinga personal or family history of depression.   Having depression while being pregnant.   Having premenstrual or oral contraceptive-associated mood issues.   Having exceptional life stress.   Having marital conflict.   Lacking a social support network.   Having a baby with special needs.   Having health problems such as diabetes.  SYMPTOMS Baby blues symptoms include:  Brief fluctuations in mood, such as going from extreme happiness to sadness.   Decreased concentration.   Difficulty sleeping.   Crying spells, tearfulness.   Irritability.   Anxiety.  Postpartum depression symptoms typically begin within the first month after giving birth. These symptoms include:  Difficulty sleeping or excessive sleepiness.   Marked weight loss.   Agitation.   Feelings of worthlessness.   Lack of interest in activity or food.  Postpartum psychosis is a very concerning condition and can be dangerous. Fortunately, it is rare.  Displaying any of the following symptoms is cause for immediate medical attention. Postpartum psychosis symptoms include:  Hallucinations and delusions.   Bizarre or disorganized behavior.   Confusion or disorientation.  DIAGNOSIS  A diagnosis is made by an evaluation of your symptoms. There are no medical or lab tests that lead to a diagnosis, but there are various questionnaires that a caregiver may use to identify those with the baby blues, postpartum depression, or psychosis. Often times, a screening tool called the Edinburgh Postnatal Depression Scale is used to diagnose depression in the postpartum period.  TREATMENT The baby blues usually goes away on its own in 1 to 2 weeks. Social support is often all that is needed. You should be encouraged to get adequate sleep and rest. Occasionally, you may be given medicines to help you sleep.  Postpartum depression requires treatment as it can last several months or longer if it is not treated. Treatment may include individual or group therapy, medicine, or both to address any social, physiological, and psychological factors that may play a role in the depression. Regular exercise, a healthy diet, rest, and social support may also be strongly recommended.  Postpartum psychosis is more serious and needs treatment right away. Hospitalization is often needed. HOME CARE INSTRUCTIONS  Get as much rest as you can. Nap when the baby sleeps.   Exercise regularly. Some women find yoga and walking to be beneficial.   Eat a balanced and nourishing diet.   Do little things that you enjoy. Have a cup of tea, take a bubble bath, read your favorite magazine, or listen to your favorite music.   Avoid alcohol.   Ask for help with household chores, cooking, grocery shopping, or running errands as needed. Do not try to do everything.   Talk to people close to you about how you are feeling. Get support from your partner, family members, friends, or other new  moms.   Try to stay positive in how you think. Think about the things you are grateful for.   Do not spend a lot of time alone.   Only take medicine as directed by your caregiver.   Keep all your postpartum appointments.   Let your caregiver know if you have any concerns.  SEEK MEDICAL CARE IF: You are having a reaction or problems with your medicine. SEEK IMMEDIATE MEDICAL CARE IF:  You have suicidal feelings.     You feel you may harm the baby or someone else.  Document Released: 07/26/2004 Document Revised: 10/11/2011 Document Reviewed: 08/28/2011 ExitCare Patient Information 2012 [employer]. 

## 2012-03-05 NOTE — Progress Notes (Signed)
03/05/12 1125 D/C home instructions given to pt & spouse - all questions answered.

## 2012-03-05 NOTE — Progress Notes (Signed)
POSTOPERATIVE DAY # 3 S/P cesarean section - 34 week twins  Patient seen in NICU - patient out of room all times in NICU  S:         Reports feeling well             Tolerating po intake / no nausea / no vomiting / + flatus / No BM             Bleeding is spotting             Pain controlled with motrin and percocet             Up ad lib / ambulatory / visiting in NICU   Newborns stable in NICU - pumping / breastfeeding  / Circumcision planned              (female / female)   O:  A & O x 3             VS: Blood pressure 123/83, pulse 82, temperature 97.2 F (36.2 C), temperature source Oral, resp. rate 18, height 5' 1.5" (1.562 m), weight 70.308 kg (155 lb), SpO2 99.00%, unknown if currently breastfeeding.   Abdomen: soft, non-tender, non-distended             Fundus: firm, non-tender, U-1             Dressing OFF              Incision:  approximated with staples / no erythema / no ecchymosis / no drainage  Lochia: scant  Extremities: +1 pedal edema, no calf pain or tenderness  A:        POD # 3 S/P cesarean section - twins at 34 wks            Stable status  P:        Routine postoperative care              Lactation support as needed             Continue ambulation ad lib             DC home with [facility] instructions - ok to remain after discharge for feedings in NICU unless hospital high census   DC staples prior to discharge     Katherine Bowman 03/05/2012, 9:13 AM

## 2012-03-05 NOTE — Progress Notes (Signed)
Pt. Is discharged in the care of husband. Infant to remain in niocu. Stable denies any pain or discomfort. Staples were removed incision clean and dry  Steri in place.

## 2012-03-06 NOTE — Progress Notes (Signed)
Clinical Social Work Department PSYCHOSOCIAL ASSESSMENT - MATERNAL/CHILD 03/05/2012  Patient:  Katherine Bowman,Katherine Bowman  Account Number:  [id number]  Admit Date:  03/02/2012  Childs Name:   Katherine Bowman and Katherine Bowman Reask    Clinical Social Worker:  Alanii Ramer, LCSW   Date/Time:  03/05/2012 03:00 PM  Date Referred:  03/05/2012   Referral source  NICU     Referred reason  NICU   Other referral source:    I:  FAMILY / HOME ENVIRONMENT Child's legal guardian:  PARENT  Guardian - Name Guardian - Age Guardian - Address  Camerin Blanda [address] [place], [place] [address]  Tony Reask  same   Other household support members/support persons Other support:   Good support system    II  PSYCHOSOCIAL DATA Information Source:  Family Interview  Financial and Community Resources Employment:   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   Financial resources:  Private Insurance If Medicaid - County:    School / Grade:   Maternity Care Coordinator / Child Services Coordination / Early Interventions:  Cultural issues impacting care:   none known    III  STRENGTHS Strengths  Adequate Resources  Compliance with medical plan  Home prepared for Child (including basic supplies)  Other - See comment  Supportive family/friends  Understanding of illness   Strength comment:  Pediatric follow up will be with Dr. Clark at [facility].   IV  RISK FACTORS AND CURRENT PROBLEMS Current Problem:  None   Risk Factor & Current Problem Patient Issue Family Issue Risk Factor / Current Problem Comment   N N     V  SOCIAL WORK ASSESSMENT SW met with parents in M[facility]'s third floor room/318 to introduce myself, complete assessment and evaluate how they are coping with babies' admissions to NICU.  Parents were very pleasant, especially FOB, who did most of the talking. Their excitement over the births of their first children is apparent.  They report having everything they  need for babies at home and a good support system.  They report no issues with transportation to visit babies even though MOB had a c/section and will not be able to drive for the first couple weeks.  The only question parents had was when the pediatrician would see the babies since they had not seen him in the hospital yet.  SW explained that the babies would not be seen by their pediatrician until discharge and that we will tell them when to make the first appointment. SW explained who will be caring for the babies on a daily basis while they are in the NICU.  SW explained support services offered by NICU SW and gave contact information. SW has no social concerns at this time.      VI SOCIAL WORK PLAN Social Work Plan  Psychosocial Support/Ongoing Assessment of Needs   Type of pt/family education:   If child protective services report - county:   If child protective services report - date:   Information/referral to community resources comment:   Other social work plan:      

## 2012-05-15 ENCOUNTER — Encounter: Payer: Self-pay | Admitting: Family Medicine

## 2012-05-15 ENCOUNTER — Ambulatory Visit (INDEPENDENT_AMBULATORY_CARE_PROVIDER_SITE_OTHER): Payer: BC Managed Care – PPO | Admitting: Family Medicine

## 2012-05-15 VITALS — BP 112/60 | HR 60 | Wt 124.0 lb

## 2012-05-15 DIAGNOSIS — R079 Chest pain, unspecified: Secondary | ICD-10-CM

## 2012-05-15 NOTE — Patient Instructions (Addendum)
Check on when the symptoms come and go. Check on movement, breathing, eating, position. Try some Prilosec for at least a week

## 2012-05-15 NOTE — Progress Notes (Signed)
  Subjective:    Patient ID: Katherine Bowman, female    DOB: 1981-02-17, 31 y.o.   MRN: [id number]  HPI For the last 9 months she has been having intermittent sharp left-sided chest pains. They can last anywhere from a few seconds to half an hour. No nausea, diaphoresis, dizziness, numbness. The symptoms usually occur daily. Within the last 3 months she is now starting to get them several times per day and has moved into the mid chest area. No nausea, vomiting, indigestion. She cannot relate this to position or eating. She is breast-feeding and cannot relate this to her symptoms, she has fraternal twins. Her marriage is going well.  Review of Systems     Objective:   Physical Exam alert and in no distress. Tympanic membranes and canals are normal. Throat is clear. Tonsils are normal. Neck is supple without adenopathy or thyromegaly. Cardiac exam shows a regular sinus rhythm without murmurs or gallops. Lungs are clear to auscultation. Slight tenderness to palpation over the mid costal chondral joints however deep breathing causes no symptoms        Assessment & Plan:  The etiology of her symptoms is unclear. I discussed the possible use of a PPI. Strongly encouraged her to pay attention to her symptoms in terms of what when where and why and to check back with me.

## 2012-06-27 ENCOUNTER — Encounter: Payer: Self-pay | Admitting: Medical

## 2012-06-27 ENCOUNTER — Ambulatory Visit (INDEPENDENT_AMBULATORY_CARE_PROVIDER_SITE_OTHER): Payer: BC Managed Care – PPO | Admitting: Medical

## 2012-06-27 VITALS — BP 110/80 | HR 60 | Temp 98.0°F | Resp 18 | Wt 127.0 lb

## 2012-06-27 DIAGNOSIS — H109 Unspecified conjunctivitis: Secondary | ICD-10-CM

## 2012-06-27 MED ORDER — POLYMYXIN B-TRIMETHOPRIM 10000-0.1 UNIT/ML-% OP SOLN: 1.0000 [drp] | Freq: Four times a day (QID) | OPHTHALMIC | Status: AC

## 2012-06-27 NOTE — Progress Notes (Signed)
  Subjective:    Katherine Bowman is a 31 y.o. female who presents for c/o pink eye.  Started yesterday with redness, itching, and awoke with left eye matted and shut.  Used warm compresses.  She had some of her child's erythromycin ointment, and began using this early this morning.  No eye pain or vision loss. Denies other URI symptoms, otherwise in normal state of health.  No sick contacts with similar.   The following portions of the patient's history were reviewed and updated as appropriate: allergies, current medications, past family history, past medical history, past social history, past surgical history and problem list.  Review of Systems As noted in HPI     Objective:    BP 110/80  Pulse 60  Temp 98 F (36.7 C) (Oral)  Resp 18  Wt 127 lb (57.607 kg)       Gen: wd, wn, nad Eyes:  bilat eyes with injected conjunctiva, mild crusting in eyelids, otherwise no eye lid swelling, PEERLA, EOMi HEENT: nontender, TMs pearly, pharnyx unremarkable, nares patent  Assessment:   Encounter Diagnosis  Name Primary?  . Conjunctivitis Yes    Plan:    Discussed the diagnosis and proper care of conjunctivitis.  Stressed household Presenter, broadcasting.  script for [place] given, call/return if worse or not improving.

## 2012-06-27 NOTE — Patient Instructions (Signed)
Conjunctivitis Conjunctivitis is commonly called "pink eye." Conjunctivitis can be caused by bacterial or viral infection, allergies, or injuries. There is usually redness of the lining of the eye, itching, discomfort, and sometimes discharge. There may be deposits of matter along the eyelids. A viral infection usually causes a watery discharge, while a bacterial infection causes a yellowish, thick discharge. Pink eye is very contagious and spreads by direct contact. You may be given antibiotic eyedrops as part of your treatment. Before using your eye medicine, remove all drainage from the eye by washing gently with warm water and cotton balls. Continue to use the medication until you have awakened 2 mornings in a row without discharge from the eye. Do not rub your eye. This increases the irritation and helps spread infection. Use separate towels from other household members. Wash your hands with soap and water before and after touching your eyes. Use cold compresses to reduce pain and sunglasses to relieve irritation from light. Do not wear contact lenses or wear eye makeup until the infection is gone. SEEK MEDICAL CARE IF:   Your symptoms are not better after 3 days of treatment.   You have increased pain or trouble seeing.   The outer eyelids become very red or swollen.  Document Released: 11/29/2004 Document Revised: 10/11/2011 Document Reviewed: 10/22/2005 ExitCare Patient Information 2012 [employer]. 

## 2012-07-14 ENCOUNTER — Other Ambulatory Visit: Payer: Self-pay | Admitting: Surgery

## 2012-08-06 ENCOUNTER — Ambulatory Visit (INDEPENDENT_AMBULATORY_CARE_PROVIDER_SITE_OTHER): Payer: BC Managed Care – PPO | Admitting: Medical

## 2012-08-06 ENCOUNTER — Encounter: Payer: Self-pay | Admitting: Medical

## 2012-08-06 ENCOUNTER — Other Ambulatory Visit: Payer: Self-pay | Admitting: Medical

## 2012-08-06 ENCOUNTER — Ambulatory Visit
Admission: RE | Admit: 2012-08-06 | Discharge: 2012-08-06 | Disposition: A | Discharge status: Home / Self Care | Payer: BC Managed Care – PPO | Source: Ambulatory Visit | Attending: Medical | Admitting: Medical

## 2012-08-06 VITALS — BP 100/60 | HR 68 | Temp 98.4°F | Resp 16 | Wt 128.0 lb

## 2012-08-06 DIAGNOSIS — R61 Generalized hyperhidrosis: Secondary | ICD-10-CM

## 2012-08-06 DIAGNOSIS — R079 Chest pain, unspecified: Secondary | ICD-10-CM

## 2012-08-06 DIAGNOSIS — R5381 Other malaise: Secondary | ICD-10-CM

## 2012-08-06 DIAGNOSIS — M791 Myalgia, unspecified site: Secondary | ICD-10-CM

## 2012-08-06 DIAGNOSIS — IMO0001 Reserved for inherently not codable concepts without codable children: Secondary | ICD-10-CM

## 2012-08-06 DIAGNOSIS — M2550 Pain in unspecified joint: Secondary | ICD-10-CM

## 2012-08-06 DIAGNOSIS — R5383 Other fatigue: Secondary | ICD-10-CM

## 2012-08-06 LAB — CBC WITH DIFFERENTIAL/PLATELET
Basophils Absolute: 0 10*3/uL (ref 0.0–0.1)
Basophils Relative: 1 % (ref 0–1)
Eosinophils Absolute: 0.3 10*3/uL (ref 0.0–0.7)
Eosinophils Relative: 4 % (ref 0–5)
Lymphocytes Relative: 27 % (ref 12–46)
MCHC: 33.3 g/dL (ref 30.0–36.0)
MCV: 90.3 fL (ref 78.0–100.0)
Monocytes Absolute: 0.5 10*3/uL (ref 0.1–1.0)
Platelets: 304 10*3/uL (ref 150–400)
RDW: 13.4 % (ref 11.5–15.5)
WBC: 6.9 10*3/uL (ref 4.0–10.5)

## 2012-08-06 LAB — COMPREHENSIVE METABOLIC PANEL
ALT: 15 U/L (ref 0–35)
AST: 16 U/L (ref 0–37)
CO2: 28 mEq/L (ref 19–32)
Calcium: 10.1 mg/dL (ref 8.4–10.5)
Chloride: 103 mEq/L (ref 96–112)
Sodium: 137 mEq/L (ref 135–145)
Total Bilirubin: 0.7 mg/dL (ref 0.3–1.2)
Total Protein: 7.7 g/dL (ref 6.0–8.3)

## 2012-08-06 LAB — SEDIMENTATION RATE: Sed Rate: 1 mm/hr (ref 0–22)

## 2012-08-06 NOTE — Progress Notes (Signed)
Subjective: Here for c/o pains x 5 months.  Katherine Bowman notes this started after the birth of her children.  Gets constant pains.  They can occur from back to ribs, sometimes arms, sometimes in fingers, sometimes legs.  Sometimes feels a hot spot on her arms, may feel localized warmth associated with pain.  Katherine Bowman saw Dr. Susann Givens prior for similar, and they were going to use a watch and wait approach. The pain is daily, intermittent, sometimes sharper than at other times.  Denies trauma or injury.  Current exercise includes pilates once weekly and walking.  Works in [employer].  Drinks a lot of water.   Denies hx/o thyroid issues, denies hx/o rheumatological or connective tissues.  Denies depression or anxiety hx/o.   Denies current depression, no sleep issues, no agitation.  No diet or appetite changes.  No crying spells.  Lives at home with husband and her twins aged 48mo.  Has good support through father and family locally.  Of note, Katherine Bowman sees dermatology and recently has biopsies, and they do surveillance routinely given her numerous moles and father's hx/o non-melanoma skin cancer.  No Known Allergies  No current outpatient prescriptions on file prior to visit.    Past Medical History  Diagnosis Date  . History of HPV infection   . PP care - s/ 1C/S 4/28 (twins PPROM 33.6 wks) 03/03/2012  . IUD (intrauterine device) in place     Mirena    Past Surgical History  Procedure Date  . No past surgeries   . Laser ablation of the cervix   . Cesarean section 03/02/2012    Procedure: CESAREAN SECTION;  Surgeon: Lenoard Aden, MD;  Location: [facility];  Service: Gynecology;  Laterality: N/A;    Family History  Problem Relation Age of Onset  . Anesthesia problems Neg Hx   . Cancer Neg Hx   . Heart disease Neg Hx   . Hypertension Mother   . Stroke Mother   . Arthritis Mother   . Hypothyroidism Mother     History   Social History  . Marital Status: Married    Spouse Name: N/A    Number of Children: N/A    . Years of Education: N/A   Occupational History  . Not on file.   Social History Main Topics  . Smoking status: Former Smoker    Quit date: 08/16/2011  . Smokeless tobacco: Never Used  . Alcohol Use: 1.8 oz/week    1 Glasses of wine, 1 Cans of beer, 1 Shots of liquor per week  . Drug Use: No  . Sexually Active: Not on file   Other Topics Concern  . Not on file   Social History Narrative  . No narrative on file   Reviewed their medical, surgical, family, social, medication, and allergy history and updated chart as appropriate.  Review of Systems Constitutional: -fever, -chills, -sweats,+25 lb loss of weight after giving birth (baby weight per report, but back to normal now), +fatigue ENT:+sore nasal congestion and runny nose, but -ear pain, -sore throat Cardiology:  +chest wall pain and soreness, back to ribs, -palpitations, -edema Respiratory: -cough, -shortness of breath, -wheezing Gastroenterology: -abdominal pain, -nausea, -vomiting, -diarrhea, -constipation  Hematology: -bleeding or bruising problems Musculoskeletal: +DIPs and PIPs, some proximal forearm pain, sometimes thigh pain like a bruise, similar in upper arms, -joint swelling, -back pain Ophthalmology: -vision changes Urology: -dysuria, -difficulty urinating, -hematuria, -urinary frequency, -urgency Neurology: +headaches, -weakness, -tingling, -numbness      Objective:  Physical Exam  Filed Vitals:   08/06/12 1513  BP: 100/60  Pulse: 68  Temp: 98.4 F (36.9 C)  Resp: 16    General appearance: alert, no distress, WD/WN, lean white female Neck: supple, tender posterior neck midline, no lymphadenopathy, no thyromegaly, no masses Heart: RRR, normal S1, S2, no murmurs Lungs: CTA bilaterally, no wheezes, rhonchi, or rales Chest wall: tender along lower ankle of ribs anteriorly bilat, lower anterior chest wall tender Abdomen: +bs, soft, mild epigastric tenderness, non distended, no masses, no  hepatomegaly, no splenomegaly Back: mild tenderness thoracic paraspinal region, otherwise nontender MSK: currently nontender throughout although Katherine Bowman reports frequently having tenderness at numerous areas along her body, normal ROM, no obvious deformity Pulses: 2+ symmetric, upper and lower extremities, normal cap refill   Assessment and Plan :    Encounter Diagnoses  Name Primary?  . Myalgia Yes  . Fatigue   . Night sweats   . Joint pain   . Chest pain    Discussed possible long list of etiologies, but symptoms suggest fibromyalgia as possible diagnosis.  reviewed common tender points with fibromyalgia that Katherine Bowman admits, but is not tender on those points today. We will check baseline labs and chest xray at her request given persistent symptoms, night sweats, fatigue and hx/o "social tobacco use."  F/u pending studies.

## 2012-08-06 NOTE — Patient Instructions (Signed)

## 2012-08-08 ENCOUNTER — Telehealth: Payer: Self-pay | Admitting: Medical

## 2012-08-08 NOTE — Telephone Encounter (Signed)
I called and spoke to pt. Advised of normal labs.  Will add CPK.  She will consider chiropractic therapy, massage therapy for now.  Since she started on mirena about the time symptoms began, she will call gynecologist to inquire about mirena being a possible reason for her symptoms.  She will come in here for PPD testing for completeness.

## 2012-10-13 ENCOUNTER — Other Ambulatory Visit: Payer: Self-pay | Admitting: Surgery

## 2012-12-24 ENCOUNTER — Ambulatory Visit: Payer: BC Managed Care – PPO | Admitting: Family Medicine

## 2012-12-25 ENCOUNTER — Encounter: Payer: Self-pay | Admitting: Family Medicine

## 2012-12-25 ENCOUNTER — Ambulatory Visit (INDEPENDENT_AMBULATORY_CARE_PROVIDER_SITE_OTHER): Payer: BC Managed Care – PPO | Admitting: Family Medicine

## 2012-12-25 VITALS — BP 116/80 | HR 80 | Temp 98.1°F | Wt 133.0 lb

## 2012-12-25 DIAGNOSIS — G44309 Post-traumatic headache, unspecified, not intractable: Secondary | ICD-10-CM

## 2012-12-25 DIAGNOSIS — B9789 Other viral agents as the cause of diseases classified elsewhere: Secondary | ICD-10-CM

## 2012-12-25 DIAGNOSIS — B349 Viral infection, unspecified: Secondary | ICD-10-CM

## 2012-12-25 NOTE — Progress Notes (Signed)
  Subjective:    Patient ID: Katherine Bowman, female    DOB: [birth year], 32 y.o.   MRN: [id number]  HPI She has a 4 day history of headache, swollen glands, fatigue, itching in her ears with mild tenderness but no fever, chills, cough or congestion. She has a history of head injury approximately 3 weeks ago when she fell sustaining an injury to the left parietal area. Since that time she has had difficulty with headache, fatigue, decreased focus, photophobia. She was slowly getting better until she got the above symptoms several days ago.   Review of Systems     Objective:   Physical Exam alert and in no distress. EOMI .Tympanic membranes and canals are normal. Throat is clear. Tonsils are normal. Neck is supple without adenopathy or thyromegaly. Cardiac exam shows a regular sinus rhythm without murmurs or gallops. Lungs are clear to auscultation.        Assessment & Plan:  Posttraumatic headache  Viral syndrome I discussed the posttraumatic headache with her and explained that since she is now doing better, no further intervention necessary. Also recommend supportive care for the viral syndrome. She is to return here if further difficulty.

## 2012-12-25 NOTE — Patient Instructions (Signed)
Just treat your symptoms. If things get worse, call me.

## 2013-02-06 ENCOUNTER — Ambulatory Visit (INDEPENDENT_AMBULATORY_CARE_PROVIDER_SITE_OTHER): Payer: BC Managed Care – PPO | Admitting: Family Medicine

## 2013-02-06 ENCOUNTER — Encounter: Payer: Self-pay | Admitting: Family Medicine

## 2013-02-06 ENCOUNTER — Ambulatory Visit
Admission: RE | Admit: 2013-02-06 | Discharge: 2013-02-06 | Disposition: A | Discharge status: Home / Self Care | Payer: BC Managed Care – PPO | Source: Ambulatory Visit | Attending: Family Medicine | Admitting: Family Medicine

## 2013-02-06 VITALS — BP 116/70 | HR 88 | Wt 129.0 lb

## 2013-02-06 DIAGNOSIS — K5900 Constipation, unspecified: Secondary | ICD-10-CM

## 2013-02-06 NOTE — Progress Notes (Signed)
  Subjective:    Patient ID: Katherine Bowman, female    DOB: 09-09-1981, 31 y.o.   MRN: [id number]  HPI She is here for evaluation of a change in her bowel habits. Normally she would have 2 or 3 BMs per day that would be soft and formed. Approximately 3 weeks ago she noted the onset of fullness, difficulty with BM, having it come out harder and thinner. She has used Colace and senna however this causes diarrhea.   Review of Systems     Objective:   Physical Exam Alert and in no distress. Abdominal exam shows no bowel sounds, masses or tenderness. Rectal exam showed no stool in the vault. Guaiac was negative The x-ray was negative.      Assessment & Plan:  Constipation - Plan: DG Abd 1 View I will have her switch to Metamucil, drink plenty of fluids and monitor this over the next week or 2. If continued difficulty I will recommend GI evaluation.

## 2013-04-06 ENCOUNTER — Encounter: Payer: Self-pay | Admitting: Family Medicine

## 2013-04-06 ENCOUNTER — Ambulatory Visit (INDEPENDENT_AMBULATORY_CARE_PROVIDER_SITE_OTHER): Payer: BC Managed Care – PPO | Admitting: Family Medicine

## 2013-04-06 VITALS — BP 112/70 | HR 83 | Temp 98.2°F | Wt 126.0 lb

## 2013-04-06 DIAGNOSIS — L738 Other specified follicular disorders: Secondary | ICD-10-CM

## 2013-04-06 DIAGNOSIS — L739 Follicular disorder, unspecified: Secondary | ICD-10-CM

## 2013-04-06 DIAGNOSIS — K5289 Other specified noninfective gastroenteritis and colitis: Secondary | ICD-10-CM

## 2013-04-06 DIAGNOSIS — K529 Noninfective gastroenteritis and colitis, unspecified: Secondary | ICD-10-CM

## 2013-04-06 NOTE — Patient Instructions (Signed)
For the nausea use Emetrol.you can take as many as 8 Imodium per day to help control the diarrhea. Eat whatever you want to.

## 2013-04-06 NOTE — Progress Notes (Signed)
  Subjective:    Patient ID: Katherine Bowman, female    DOB: [birth year], 32 y.o.   MRN: [id number]  HPI She recently went on a trip to [place]. After she returned she noted difficulty with nausea, vomiting, diarrhea. She also noted a rash present on the lower back area however this occurred after she used a hair removing material.   Review of Systems     Objective:   Physical Exam alert and in no distress. Tympanic membranes and canals are normal. Throat is clear. Tonsils are normal. Neck is supple without adenopathy or thyromegaly. Cardiac exam shows a regular sinus rhythm without murmurs or gallops. Lungs are clear to auscultation. Exam of her low back does show healing follicular lesions.       Assessment & Plan:  Acute gastroenteritis  Folliculitis recommend supportive care for gastroenteritis with Emetrol and Imodium as well as eating anything she is comfortable with. No therapy for the folliculitis since it's getting better on its own.

## 2013-05-22 ENCOUNTER — Encounter: Payer: Self-pay | Admitting: Family Medicine

## 2013-05-22 ENCOUNTER — Ambulatory Visit (INDEPENDENT_AMBULATORY_CARE_PROVIDER_SITE_OTHER): Payer: BC Managed Care – PPO | Admitting: Family Medicine

## 2013-05-22 VITALS — BP 110/70 | HR 80 | Wt 124.0 lb

## 2013-05-22 DIAGNOSIS — IMO0001 Reserved for inherently not codable concepts without codable children: Secondary | ICD-10-CM

## 2013-05-22 DIAGNOSIS — M797 Fibromyalgia: Secondary | ICD-10-CM

## 2013-05-22 MED ORDER — NORTRIPTYLINE HCL 10 MG PO CAPS: 10.0000 mg | ORAL_CAPSULE | Freq: Every day | ORAL | Status: DC

## 2013-05-22 NOTE — Patient Instructions (Signed)
Try it  for about a week and see how you do and give [place] a call

## 2013-05-22 NOTE — Progress Notes (Signed)
  Subjective:    Patient ID: Katherine Bowman, female    DOB: [birth year], 32 y.o.   MRN: [id number]  HPI She is here for consult concerning continued difficulty with aches and pains. Has been seen in the past and was told that this was fibromyalgia however at that time she is not interested in medication. She complains of aching type pains in arms, legs, fingers and toes. The pain can come and go. She also has daily headache and fatigue with this but no difficulty with sleep.   Review of Systems     Objective:   Physical Exam alert and in no distress. Tympanic membranes and canals are normal. Throat is clear. Tonsils are normal. Neck is supple without adenopathy or thyromegaly. Cardiac exam shows a regular sinus rhythm without murmurs or gallops. Lungs are clear to auscultation. No point tenderness of on palpation of the neck arms or legs. DTRs normal. Review of blood work was essentially negative        Assessment & Plan:  Fibromyalgia - Plan: nortriptyline (PAMELOR) 10 MG capsule  I discussed various options with her and we'll start with nortriptyline. Did discuss the use of Lyrica as well as Cymbalta and possible referral to rheumatology if we can get this under better control. She is comfortable with this approach.

## 2013-05-25 ENCOUNTER — Telehealth: Payer: Self-pay | Admitting: Internal Medicine

## 2013-05-25 NOTE — Telephone Encounter (Signed)
Pt states that the med Nortriptyline you gave her Friday, she is having some side effects. She states she is tired and has no energy, the med is helping from her having no pain but she just feels like she is in a fog. She wanted to know what to do?

## 2013-05-25 NOTE — Telephone Encounter (Signed)
PT SAID HER MED IS A CAPSUL SO I TOLD HER TO KEEP TAKING FOR THE NEXT WEEK TO SEE IF SHE GETS USE TO IT AND TO CALL NEXT WEEK AND LET us KNOW

## 2013-05-25 NOTE — Telephone Encounter (Signed)
I'm happy it's helping. If she can cut the pill in half, that would be one way to potentially help this also explained that with time the sedation becomes less of an issue so hopefully over the next week this will become easier to deal with. Tell her to keep in touch

## 2013-06-01 ENCOUNTER — Ambulatory Visit (INDEPENDENT_AMBULATORY_CARE_PROVIDER_SITE_OTHER): Payer: BC Managed Care – PPO | Admitting: Family Medicine

## 2013-06-01 ENCOUNTER — Encounter: Payer: Self-pay | Admitting: Family Medicine

## 2013-06-01 VITALS — BP 110/70 | HR 68 | Wt 124.0 lb

## 2013-06-01 DIAGNOSIS — IMO0001 Reserved for inherently not codable concepts without codable children: Secondary | ICD-10-CM

## 2013-06-01 DIAGNOSIS — M797 Fibromyalgia: Secondary | ICD-10-CM

## 2013-06-01 MED ORDER — DULOXETINE HCL 30 MG PO CPEP: 30.0000 mg | ORAL_CAPSULE | Freq: Every day | ORAL | Status: DC

## 2013-06-01 NOTE — Patient Instructions (Signed)
Call me in one week and let he know how you're doing

## 2013-06-01 NOTE — Progress Notes (Signed)
  Subjective:    Patient ID: Katherine Bowman, female    DOB: 1981/10/06, 32 y.o.   MRN: [id number]  HPI She is here for a recheck. She states that the nortriptyline to take her pain away but she had unacceptable side effects of fatigue and feeling numb. She also states that she felt like her brain was fuzzy and she was having an out of body experience. She is very concerned that her symptoms are related to cancer and wants testing done.   Review of Systems     Objective:   Physical Exam Alert and in no distress otherwise not examined       Assessment & Plan:  Fibromyalgia - Plan: DULoxetine (CYMBALTA) 30 MG capsule  I reassured her that her symptoms were not cancer related and there was no general screening tests that we could do for cancer. I informed her that since she responded to the nortriptyline, it essentially ensure that there was no other major issues going on. I will try her on Cymbalta. Discussed the use of this and possible side effects. She is to call me in one week and let he know how she is doing.

## 2013-06-30 ENCOUNTER — Other Ambulatory Visit: Payer: Self-pay | Admitting: Family Medicine

## 2013-06-30 NOTE — Telephone Encounter (Signed)
Is this okay to refill? 

## 2013-06-30 NOTE — Telephone Encounter (Signed)
She was suppose to call back and let us know how the medication is helping.   Please inquire?

## 2013-07-01 NOTE — Telephone Encounter (Signed)
Pt called back and states meds are helping and does want to continue Cymbalta.  Pt needs a refill.

## 2013-08-03 ENCOUNTER — Other Ambulatory Visit: Payer: Self-pay | Admitting: Medical

## 2013-09-28 ENCOUNTER — Other Ambulatory Visit: Payer: Self-pay | Admitting: Physician Assistant

## 2014-02-25 ENCOUNTER — Other Ambulatory Visit: Payer: Self-pay | Admitting: Family Medicine

## 2014-02-25 NOTE — Telephone Encounter (Signed)
Is this okay to refill? 

## 2014-07-29 ENCOUNTER — Other Ambulatory Visit: Payer: Self-pay | Admitting: Family Medicine

## 2014-08-11 ENCOUNTER — Ambulatory Visit: Payer: BC Managed Care – PPO | Admitting: Family Medicine

## 2014-08-12 ENCOUNTER — Ambulatory Visit: Payer: BC Managed Care – PPO | Admitting: Medical

## 2014-08-31 ENCOUNTER — Encounter: Payer: Self-pay | Admitting: Medical

## 2014-08-31 ENCOUNTER — Ambulatory Visit (INDEPENDENT_AMBULATORY_CARE_PROVIDER_SITE_OTHER): Payer: BC Managed Care – PPO | Admitting: Medical

## 2014-08-31 VITALS — BP 92/60 | HR 62 | Temp 98.2°F | Wt 133.0 lb

## 2014-08-31 DIAGNOSIS — B349 Viral infection, unspecified: Secondary | ICD-10-CM

## 2014-08-31 DIAGNOSIS — J029 Acute pharyngitis, unspecified: Secondary | ICD-10-CM

## 2014-08-31 DIAGNOSIS — R195 Other fecal abnormalities: Secondary | ICD-10-CM

## 2014-08-31 DIAGNOSIS — B9789 Other viral agents as the cause of diseases classified elsewhere: Secondary | ICD-10-CM

## 2014-08-31 DIAGNOSIS — J069 Acute upper respiratory infection, unspecified: Secondary | ICD-10-CM

## 2014-08-31 DIAGNOSIS — J028 Acute pharyngitis due to other specified organisms: Principal | ICD-10-CM

## 2014-08-31 DIAGNOSIS — R110 Nausea: Secondary | ICD-10-CM

## 2014-08-31 LAB — POCT RAPID STREP A (OFFICE): RAPID STREP A SCREEN: NEGATIVE

## 2014-08-31 NOTE — Progress Notes (Signed)
Subjective:  [name] is a 33 y.o. female who presents for 3 day hx/o illness.  Started feeling bad since Sunday morning 3 days ago with sore throat, fever whole day, was in bed last 2 days all day.  Worse again yesterday.  But a little better today, no fever now, still has sore throat, ears with pressure, no sinus pressure,  Chills, body aches, some nv x 1, some diarrhea, last 3 days, 2 times per day.  Kids with cold symptoms at home and they are improving.  No other aggravating or relieving factors.  No other c/o.  ROS as in subjective.   Objective: Filed Vitals:   08/31/14 1021  BP: 92/60  Pulse: 62  Temp: 98.2 F (36.8 C)    General appearance: Alert, WD/WN, no distress, mildly ill appearing                             Skin: warm, no rash                           Head: no sinus tenderness                            Eyes: conjunctiva normal, corneas clear, PERRLA                            Ears: pearly TMs, external ear canals normal                          Nose: septum midline, turbinates swollen, with erythema and clear discharge             Mouth/throat: MMM, tongue normal, mild to moderate pharyngeal erythema                           Neck: supple,shoddy tender anterior nodes, no thyromegaly, nontender                         Lungs: CTA bilaterally, no wheezes, rales, or rhonchi     Assessment: Encounter Diagnoses  Name Primary?  . Sore throat (viral) Yes  . Viral syndrome   . Upper respiratory infection   . Loose stools   . Nausea without vomiting      Plan: Step test negative.  Discussed diagnosis of viral syndrome and treatment.  Suggested symptomatic OTC remedies.  Ibuprofen OTC for fever and malaise.  Call/return in 2-3 days if symptoms aren't resolving.

## 2014-09-06 ENCOUNTER — Encounter: Payer: Self-pay | Admitting: Medical

## 2015-01-10 ENCOUNTER — Encounter: Payer: Self-pay | Admitting: Family Medicine

## 2015-01-10 ENCOUNTER — Ambulatory Visit (INDEPENDENT_AMBULATORY_CARE_PROVIDER_SITE_OTHER): Payer: BLUE CROSS/BLUE SHIELD | Admitting: Family Medicine

## 2015-01-10 VITALS — BP 130/82 | HR 71 | Temp 97.8°F | Wt 132.0 lb

## 2015-01-10 DIAGNOSIS — J029 Acute pharyngitis, unspecified: Secondary | ICD-10-CM

## 2015-01-10 DIAGNOSIS — K120 Recurrent oral aphthae: Secondary | ICD-10-CM

## 2015-01-10 LAB — POCT RAPID STREP A (OFFICE): Rapid Strep A Screen: NEGATIVE

## 2015-01-10 MED ORDER — TRIAMCINOLONE ACETONIDE 0.1 % MT PSTE: 1.0000 "application " | PASTE | Freq: Two times a day (BID) | OROMUCOSAL | Status: DC

## 2015-01-10 NOTE — Patient Instructions (Signed)
Canker Sores  °Canker sores are painful, open sores on the inside of the mouth and cheek. They may be white or yellow. The sores usually heal in 1 to 2 weeks. Women are more likely than men to have recurrent canker sores. °CAUSES °The cause of canker sores is not well understood. More than one cause is likely. Canker sores do not appear to be caused by certain types of germs (viruses or bacteria). Canker sores may be caused by: °· An allergic reaction to certain foods. °· Digestive problems. °· Not having enough vitamin B12, folic acid, and iron. °· Female sex hormones. Sores may come only during certain phases of a menstrual cycle. Often, there is improvement during pregnancy. °· Genetics. Some people seem to inherit canker sore problems. °Emotional stress and injuries to the mouth may trigger outbreaks, but not cause them.  °DIAGNOSIS °Canker sores are diagnosed by exam.  °TREATMENT °· Patients who have frequent bouts of canker sores may have cultures taken of the sores, blood tests, or allergy tests. This helps determine if their sores are caused by a poor diet, an allergy, or some other preventable or treatable disease. °· Vitamins may prevent recurrences or reduce the severity of canker sores in people with poor nutrition. °· Numbing ointments can relieve pain. These are available in drug stores without a prescription. °· Anti-inflammatory steroid mouth rinses or gels may be prescribed by your caregiver for severe sores. °· Oral steroids may be prescribed if you have severe, recurrent canker sores. These strong medicines can cause many side effects and should be used only under the close direction of a dentist or physician. °· Mouth rinses containing the antibiotic medicine may be prescribed. They may lessen symptoms and speed healing. °Healing usually happens in about 1 or 2 weeks with or without treatment. Certain antibiotic mouth rinses given to pregnant women and young children can permanently stain teeth.  Talk to your caregiver about your treatment. °HOME CARE INSTRUCTIONS  °· Avoid foods that cause canker sores for you. °· Avoid citrus juices, spicy or salty foods, and coffee until the sores are healed. °· Use a soft-bristled toothbrush. °· Chew your food carefully to avoid biting your cheek. °· Apply topical numbing medicine to the sore to help relieve pain. °· Apply a thin paste of baking soda and water to the sore to help heal the sore. °· Only use mouth rinses or medicines for pain or discomfort as directed by your caregiver. °SEEK MEDICAL CARE IF:  °· Your symptoms are not better in 1 week. °· Your sores are still present after 2 weeks. °· Your sores are very painful. °· You have trouble breathing or swallowing. °· Your sores come back frequently. °Document Released: 02/16/2011 Document Revised: 02/16/2013 Document Reviewed: 02/16/2011 °Exit[employer]® Patient Information ©2015 [employer]. This information is not intended to replace advice given to you by your health care provider. Make sure you discuss any questions you have with your health care provider. ° °

## 2015-01-10 NOTE — Progress Notes (Signed)
   Subjective:    Patient ID: Katherine Bowman, female    DOB: 1981/08/23, [age] y.o.   MRN: [id number]  HPI She complains of a several day history of sore throat and a painful lesion on the right side of her tongue. No fever, chills, cough or congestion.   Review of Systems     Objective:   Physical Exam Alert and in no distress. Tympanic membranes and canals are normal. Pharyngeal area is normal. Neck is supple without adenopathy or thyromegaly. Cardiac exam shows a regular sinus rhythm without murmurs or gallops. Lungs are clear to auscultation. Ulcerated linear lesion noted on the right lateral tongue. Strep screen is negative.        Assessment & Plan:  Aphthous ulcer of tongue - Plan: triamcinolone (KENALOG) 0.1 % paste  Acute pharyngitis, unspecified pharyngitis type - Plan: Rapid Strep A  supportive care for her sore throat. She will call if further difficulty. Explained Her use of the Kenalog to her.

## 2015-03-30 ENCOUNTER — Ambulatory Visit: Payer: Self-pay | Admitting: Medical

## 2015-03-31 ENCOUNTER — Ambulatory Visit: Payer: Self-pay | Admitting: Medical

## 2015-09-09 ENCOUNTER — Ambulatory Visit: Payer: BLUE CROSS/BLUE SHIELD | Admitting: Medical

## 2015-09-12 ENCOUNTER — Ambulatory Visit: Payer: BLUE CROSS/BLUE SHIELD | Admitting: Medical

## 2015-10-26 ENCOUNTER — Ambulatory Visit (INDEPENDENT_AMBULATORY_CARE_PROVIDER_SITE_OTHER): Payer: BLUE CROSS/BLUE SHIELD | Admitting: Family Medicine

## 2015-10-26 ENCOUNTER — Encounter: Payer: Self-pay | Admitting: Family Medicine

## 2015-10-26 VITALS — BP 124/74 | HR 60 | Temp 98.1°F | Wt 132.8 lb

## 2015-10-26 DIAGNOSIS — J029 Acute pharyngitis, unspecified: Secondary | ICD-10-CM | POA: Diagnosis not present

## 2015-10-26 DIAGNOSIS — J019 Acute sinusitis, unspecified: Secondary | ICD-10-CM

## 2015-10-26 MED ORDER — AMOXICILLIN 875 MG PO TABS: 875.0000 mg | ORAL_TABLET | Freq: Two times a day (BID) | ORAL | Status: DC

## 2015-10-26 NOTE — Progress Notes (Signed)
   Subjective:    Patient ID: Katherine Bowman, female    DOB: November 29, 1980, 34 y.o.   MRN: [id number]  HPI Chief Complaint  Patient presents with  . sore throat    sore throat, congestion, ear pain. trouble swallowing. taking tynlenol and mucinex   She is here with complaints of a 10 day history of sore throat, right side worse than left, sinus pressure, nasal congestion and post nasal drainage.  She states she thought she was getting better about 4 days ago and then she got worse for past 2 days.  Denies fever but positive for chills and body aches.  Does not smoke. Positive sick contacts. She is immunocompetent States she has been taking tylenol around the clock for sore throat and Mucinex.   Reviewed allergies, medications, past medical and social history.   Review of Systems Pertinent positives and negatives in the history of present illness.    Objective:   Physical Exam BP 124/74 mmHg  Pulse 60  Temp(Src) 98.1 F (36.7 C) (Oral)  Wt 132 lb 12.8 oz (60.238 kg)  Alert and in no distress. Right maxillary and frontal sinus tenderness. Nares red. Right tympanic membrane with fluid left tympanic membrane normal and canals are normal. Pharyngeal area is erythematous without edema or exudate. Neck is supple without adenopathy but with right anterior cervical lymph node tenderness. Cardiac exam shows a regular sinus rhythm without murmurs or gallops. Lungs are clear to auscultation.      Assessment & Plan:  Acute pharyngitis, unspecified etiology - Plan: amoxicillin (AMOXIL) 875 MG tablet  Acute sinusitis, unspecified - Plan: amoxicillin (AMOXIL) 875 MG tablet  Discussed that due to the course of her illness I suspect she has a bacterial infection. Antibiotic prescribed. Also discussed symptom management treatment with Sudafed, Tylenol or ibuprofen, and staying well hydrated. Recommended saltwater gargles and warm fluids and throat lozenges for her sore throat. She will let me know if  she is not back to normal after completing the antibiotic.

## 2015-10-26 NOTE — Patient Instructions (Signed)

## 2015-11-10 ENCOUNTER — Encounter: Payer: Self-pay | Admitting: Family Medicine

## 2015-11-10 ENCOUNTER — Ambulatory Visit (INDEPENDENT_AMBULATORY_CARE_PROVIDER_SITE_OTHER): Payer: BLUE CROSS/BLUE SHIELD | Admitting: Family Medicine

## 2015-11-10 VITALS — BP 112/72 | HR 64 | Temp 97.8°F | Wt 133.6 lb

## 2015-11-10 DIAGNOSIS — H9202 Otalgia, left ear: Secondary | ICD-10-CM

## 2015-11-10 DIAGNOSIS — J0141 Acute recurrent pansinusitis: Secondary | ICD-10-CM | POA: Diagnosis not present

## 2015-11-10 MED ORDER — PREDNISONE 50 MG PO TABS: ORAL_TABLET | ORAL | Status: DC

## 2015-11-10 MED ORDER — AMOXICILLIN-POT CLAVULANATE 875-125 MG PO TABS: 1.0000 | ORAL_TABLET | Freq: Two times a day (BID) | ORAL | Status: DC

## 2015-11-10 NOTE — Patient Instructions (Signed)
Stay well hydrated. Complete the antibiotic and the steroid and let me know if you're not back to baseline. You can continue using Sudafed and Mucinex.

## 2015-11-10 NOTE — Progress Notes (Signed)
Subjective:  [name] is a 35 y.o. female who presents for possible sinus infection.  Symptoms include sinus pressure and headache mainly on left side, left ear ache, sore left anterior neck for past 3 days. She reports having sinus infection and took 10 days of Amoxicillin and states she got better for 3 days and then symptoms returned 3 days ago.  Denies fever, chills, body ache sore throat, cough.  Past history is significant for no history of pneumonia or bronchitis. Patient is a non-smoker.  Has been taking mucinex, sudafed for symptoms also.  Positive sick contacts-husband .  No other aggravating or relieving factors.  No other c/o.  ROS as in subjective   Objective: Filed Vitals:   11/10/15 1126  BP: 112/72  Pulse: 64  Temp: 97.8 F (36.6 C)    General appearance: Alert, WD/WN, no distress                             Skin: warm, no rash                           Head: + left maxillary and ethmoid sinus tenderness,                            Eyes: conjunctiva normal, corneas clear, PERRLA                            Ears: pearly TMs, external ear canals normal                          Nose: septum midline, turbinates swollen, with erythema and clear discharge             Mouth/throat: MMM, tongue normal, mild pharyngeal erythema                           Neck: supple, left anterior cervical adenopathy and tenderness, no thyromegaly                          Heart: RRR, normal S1, S2, no murmurs                         Lungs: CTA bilaterally, no wheezes, rales, or rhonchi      Assessment and Plan: Acute recurrent pansinusitis - Plan: amoxicillin-clavulanate (AUGMENTIN) 875-125 MG tablet, predniSONE (DELTASONE) 50 MG tablet  Otalgia of left ear   Prescription given for Augmentin and Prednisone x 3 days.  Can use OTC Mucinex or Sudafed for congestion.  Tylenol or Ibuprofen OTC for fever and malaise.  Discussed symptomatic relief, nasal saline flush, and call or return if worse  or not improving in 2-3 days. Discussed possible side effects of antibiotic including GI upset and recommend taking a probiotic or eating yogurt to help this. Discussed that if her sinus infection does not completing improve or if she gets another one in next few months that she will need further testing including imaging of sinuses or possible ENT referral.

## 2015-11-28 ENCOUNTER — Ambulatory Visit (INDEPENDENT_AMBULATORY_CARE_PROVIDER_SITE_OTHER): Payer: BLUE CROSS/BLUE SHIELD | Admitting: Family Medicine

## 2015-11-28 ENCOUNTER — Encounter: Payer: Self-pay | Admitting: Family Medicine

## 2015-11-28 VITALS — BP 120/72 | HR 78 | Ht 61.0 in | Wt 133.0 lb

## 2015-11-28 DIAGNOSIS — R0602 Shortness of breath: Secondary | ICD-10-CM

## 2015-11-28 DIAGNOSIS — M797 Fibromyalgia: Secondary | ICD-10-CM | POA: Insufficient documentation

## 2015-11-28 DIAGNOSIS — F40241 Acrophobia: Secondary | ICD-10-CM

## 2015-11-28 DIAGNOSIS — F40298 Other specified phobia: Secondary | ICD-10-CM

## 2015-11-28 MED ORDER — DULOXETINE HCL 30 MG PO CPEP: 30.0000 mg | ORAL_CAPSULE | Freq: Every day | ORAL | Status: DC

## 2015-11-28 NOTE — Progress Notes (Signed)
   Subjective:    Patient ID: Katherine Bowman, female    DOB: 1981/01/17, 35 y.o.   MRN: [id number]  HPI Her for a consult concerning anxiety issues revolving around possibly having lung cancer. She states that her mother smoked but she was growing up. She also smoked socially until approximately 3 years ago. She has a previous history of fibromyalgia and initially responded to nortriptyline but did have unacceptable side effects. She was then switched to Cymbalta which worked well however she stopped taking this approximately one year ago when she states her symptoms went away. Over the last 6 months she has had what she describes as a flares of feeling short of breath and having shoulder and upper back discomfort. This tends to be less of a problem in the morning and gets worse as the day goes on. She also complains of occasional chest tightness and rapid heart rate although she's never checked her heart rate. She looked this up online and is interested in having further testing done.   Review of Systems     Objective:   Physical Exam Alert and in no distress. PFT was done which did show moderate change.       Assessment & Plan:  SOB (shortness of breath) - Plan: Spirometry with graph, CT Chest Wo Contrast, CANCELED: Spirometry with graph  Fibromyalgia - Plan: DULoxetine (CYMBALTA) 30 MG capsule  Cancer phobia I explained that her symptoms were deathly not related to lung cancer however she was still quite anxious over this and wanted to have a CT scan done. I explained that the insurance would not pay for this and she was willing to pay for this herself. I will also start her back on Cymbalta. I explained that I thought her symptoms were more related to fibromyalgia. She remains convinced that she could have cancer.

## 2015-11-29 ENCOUNTER — Ambulatory Visit
Admission: RE | Admit: 2015-11-29 | Discharge: 2015-11-29 | Disposition: A | Discharge status: Home / Self Care | Payer: BLUE CROSS/BLUE SHIELD | Source: Ambulatory Visit | Attending: Family Medicine | Admitting: Family Medicine

## 2015-11-29 DIAGNOSIS — R0602 Shortness of breath: Secondary | ICD-10-CM

## 2015-12-28 ENCOUNTER — Encounter: Payer: BLUE CROSS/BLUE SHIELD | Admitting: Family Medicine

## 2016-02-08 ENCOUNTER — Ambulatory Visit (INDEPENDENT_AMBULATORY_CARE_PROVIDER_SITE_OTHER): Payer: BLUE CROSS/BLUE SHIELD | Admitting: Medical

## 2016-02-08 ENCOUNTER — Encounter: Payer: Self-pay | Admitting: Medical

## 2016-02-08 VITALS — BP 120/80 | HR 94 | Temp 99.6°F | Resp 16 | Wt 130.0 lb

## 2016-02-08 DIAGNOSIS — L259 Unspecified contact dermatitis, unspecified cause: Secondary | ICD-10-CM | POA: Diagnosis not present

## 2016-02-08 DIAGNOSIS — J0100 Acute maxillary sinusitis, unspecified: Secondary | ICD-10-CM

## 2016-02-08 DIAGNOSIS — J329 Chronic sinusitis, unspecified: Secondary | ICD-10-CM | POA: Diagnosis not present

## 2016-02-08 LAB — CBC WITH DIFFERENTIAL/PLATELET
BASOS ABS: 0 {cells}/uL (ref 0–200)
Basophils Relative: 0 %
EOS PCT: 3 %
Eosinophils Absolute: 300 cells/uL (ref 15–500)
HCT: 40.6 % (ref 35.0–45.0)
Hemoglobin: 13.2 g/dL (ref 11.7–15.5)
LYMPHS ABS: 1200 {cells}/uL (ref 850–3900)
LYMPHS PCT: 12 %
MCH: 29.5 pg (ref 27.0–33.0)
MCHC: 32.5 g/dL (ref 32.0–36.0)
MCV: 90.8 fL (ref 80.0–100.0)
MONOS PCT: 10 %
MPV: 10.3 fL (ref 7.5–12.5)
Monocytes Absolute: 1000 cells/uL — ABNORMAL HIGH (ref 200–950)
NEUTROS PCT: 75 %
Neutro Abs: 7500 cells/uL (ref 1500–7800)
PLATELETS: 238 10*3/uL (ref 140–400)
RBC: 4.47 MIL/uL (ref 3.80–5.10)
RDW: 13.1 % (ref 11.0–15.0)
WBC: 10 10*3/uL (ref 4.0–10.5)

## 2016-02-08 MED ORDER — TRIAMCINOLONE ACETONIDE 0.1 % EX CREA
1.0000 | TOPICAL_CREAM | Freq: Two times a day (BID) | CUTANEOUS | Status: DC
Start: 2016-02-08 — End: 2016-08-16

## 2016-02-08 MED ORDER — AMOXICILLIN-POT CLAVULANATE 875-125 MG PO TABS: 1.0000 | ORAL_TABLET | Freq: Two times a day (BID) | ORAL | Status: DC

## 2016-02-08 NOTE — Progress Notes (Signed)
Subjective:  Katherine Bowman is a 35 y.o. female who presents for possible sinus infection.  She has twin 82 year olds in preschool and thinks she picked up germs from them.  She notes about a week hx/o sinus pain worsening, head stuffiness, congestion, drainage in throat, some cough, and now hoarseness in last 24 hours, bad headache, some nausea.  No appetite.   When she has been able to get out mucous from sinus its either yellow orange or blood tinged.   No SOB, wheezing, no chills.  Possible low grade fever.  Has used a variety of things including Mucinex, Advil Cold and Sinus, Sudafed.   No other aggravating or relieving factors.   She has second c/o rash in armpits since she started a new deodorant which she has subsequently stopped.   No other rash.    Nonsmoker.   No other complaint.  Past Medical History  Diagnosis Date  . History of HPV infection   . PP care - s/ 1C/S 4/28 (twins PPROM 33.6 wks) 03/03/2012    ROS as in subjective   Objective: BP 120/80 mmHg  Pulse 94  Temp(Src) 99.6 F (37.6 C) (Tympanic)  Resp 16  Wt 130 lb (58.968 kg)  SpO2 98%  LMP 01/28/2016  General appearance: Alert, WD/WN, no distress                             Skin: warm, flat pink/red patch of rash in bilat axilla, worse L>R                           Head: +maxillary sinus tenderness,                            Eyes: conjunctiva normal, corneas clear, PERRLA                            Ears: pearly TMs, external ear canals normal                          Nose: septum midline, turbinates swollen, with erythema and mucoid discharge             Mouth/throat: MMM, tongue normal, mild pharyngeal erythema                           Neck: supple, no adenopathy, no thyromegaly, non tender                         Lungs: CTA bilaterally, no wheezes, rales, or rhonchi      Assessment  Encounter Diagnoses  Name Primary?  . Acute maxillary sinusitis, recurrence not specified Yes  . Recurrent sinus  infections   . Contact dermatitis       Plan: Sinusitis - begin Augmentin, rest, hydrate well, use Mucinex DM OTC, and if not resolved within a week, then call back  Contact dermatitis - don't use any more of the new deodorant. Begin 7-10 days of triamcinolone cream, and if not completely resoled within a week then call back.  Avoid prolonged use of the steroid cream.   Recurrent sinusitis - CBC  Advised she return soon for completely physical.  Katherine Bowman was seen today for nasal  congestion.  Diagnoses and all orders for this visit:  Acute maxillary sinusitis, recurrence not specified -     CBC with Differential/Platelet  Recurrent sinus infections -     CBC with Differential/Platelet  Contact dermatitis  Other orders -     amoxicillin-clavulanate (AUGMENTIN) 875-125 MG tablet; Take 1 tablet by mouth 2 (two) times daily. -     triamcinolone cream (KENALOG) 0.1 %; Apply 1 application topically 2 (two) times daily.  Patient was advised to call or return if worse or not improving in the next few days.    Patient voiced understanding of diagnosis, recommendations, and treatment plan.

## 2016-03-26 ENCOUNTER — Ambulatory Visit: Payer: BLUE CROSS/BLUE SHIELD | Admitting: Family Medicine

## 2016-03-27 ENCOUNTER — Ambulatory Visit: Payer: BLUE CROSS/BLUE SHIELD | Admitting: Family Medicine

## 2016-04-15 DIAGNOSIS — J029 Acute pharyngitis, unspecified: Secondary | ICD-10-CM | POA: Diagnosis not present

## 2016-04-26 ENCOUNTER — Encounter: Payer: Self-pay | Admitting: Family Medicine

## 2016-04-26 ENCOUNTER — Ambulatory Visit (INDEPENDENT_AMBULATORY_CARE_PROVIDER_SITE_OTHER): Payer: BLUE CROSS/BLUE SHIELD | Admitting: Family Medicine

## 2016-04-26 VITALS — BP 112/60 | HR 86 | Temp 99.1°F | Ht 61.5 in | Wt 133.0 lb

## 2016-04-26 DIAGNOSIS — J069 Acute upper respiratory infection, unspecified: Secondary | ICD-10-CM

## 2016-04-26 DIAGNOSIS — J029 Acute pharyngitis, unspecified: Secondary | ICD-10-CM

## 2016-04-26 LAB — POCT RAPID STREP A (OFFICE): RAPID STREP A SCREEN: NEGATIVE

## 2016-04-26 NOTE — Progress Notes (Signed)
Chief Complaint  Patient presents with  . lump in throat with swallowing.    pt describes this as swelling in throat with pain when swallowing. Seen at walk-in 2 weeks ago dx with virus, sx improved and now sx have returned. pt concerned with maternal hx of goiter's and has to have thyroid removed.    On and off over the last week she can feel a lump in her throat when she swallows. It was painful to swallow.  She went to UC on 6/11, was told it wasn't strep, was a virus. The swelling and tightness in the throat got better, but yesterday she felt a lump on her right side of her throat, like something was hitting something on the right side. Almost feels like something could be stuck there. Today she feels better. The soreness in the throat seems to move back and forth to both sides.  She does have a cough, felt like she needed to clear something from her throat.  She got up a little phlegm, pretty clear in color, slightly yellowish in the morning. Denies runny nose, sniffles, sinus pressure.    She started coughing on 6/18. [place] was [place] 6[place]13[place], and admits to heavier drinking than normal (for a friend's 40th birthday).  She had some nausea yesterday.  Some reflux after eating pizza last night.  Otherwise no significant heartburn.  Denies dysphagia or chest pain  She admits to being worried about her thyroid, due to her mom currently having cancer.  Past Medical History  Diagnosis Date  . History of HPV infection   . PP care - s/ 1C/S 4/28 (twins PPROM 33.6 wks) 03/03/2012    Past Surgical History  Procedure Laterality Date  . Laser ablation of the cervix    . Cesarean section  03/02/2012    Procedure: CESAREAN SECTION;  Surgeon: Lenoard Adenichard J Taavon, MD;  Location: [facility];  Service: Gynecology;  Laterality: N/A;    Social History   Social History  . Marital Status: Married    Spouse Name: N/A  . Number of Children: 2  . Years of Education: N/A   Occupational History  . Not on  file.   Social History Main Topics  . Smoking status: Former Smoker    Quit date: 08/16/2011  . Smokeless tobacco: Never Used  . Alcohol Use: 1.8 oz/week    1 Glasses of wine, 1 Cans of beer, 1 Shots of liquor per week     Comment: 2x/week, 2-3 glasses of wine at a time  . Drug Use: No  . Sexual Activity:    Partners: Male    Birth Control/ Protection: Other-see comments     Comment: husband with vasectomy   Other Topics Concern  . Not on file   Social History Narrative   Married, 37 year old twins, 1 dog.   Homemaker    Family History  Problem Relation Age of Onset  . Anesthesia problems Neg Hx   . Heart disease Neg Hx   . Hypertension Mother   . Stroke Mother   . Arthritis Mother   . Hypothyroidism Mother     goiter  . Cancer Mother [id number]    lung and thyroid    Outpatient Encounter Prescriptions as of 04/26/2016  Medication Sig  . DULoxetine (CYMBALTA) 30 MG capsule Take 1 capsule (30 mg total) by mouth daily. (Patient not taking: Reported on 02/08/2016)  . triamcinolone cream (KENALOG) 0.1 % Apply 1 application topically 2 (two) times  daily. (Patient not taking: Reported on 04/26/2016)  . [DISCONTINUED] amoxicillin-clavulanate (AUGMENTIN) 875-125 MG tablet Take 1 tablet by mouth 2 (two) times daily.   No facility-administered encounter medications on file as of 04/26/2016.    No Known Allergies  ROS:  No headaches, dizziness, chest pain, shortness of breath. No vomiting, diarrhea, bleeding, bruise, rash, joint pains. See HPI  PHYSICAL EXAM: BP 112/60 mmHg  Pulse 86  Temp(Src) 99.1 F (37.3 C) (Oral)  Ht 5' 1.5" (1.562 m)  Wt 133 lb (60.328 kg)  BMI 24.73 kg/m2  LMP 03/29/2016  Well appearing, pleasant female in no distress. No significant coughing during visit, occasional throat-clearing. HEENT: PERRL, EOMI, conjunctiva and sclera are clear.  Nasal mucosa is moderately edematous with white mucus.   OP is clear--normal tonsils, no erythema, exudate or other  lesions. Slight whitish coating to tongue Neck: No lymphadenopathy, thyromegaly or mass Heart: regular rate and rhythm without murmur Lungs: clear bilaterally Skin: normal turgor, no rash Psych: normal mood, affect, hygiene and grooming Neuro: alert and oriented, cranial nerves intact, normal strength, gait  Negative rapid strep  ASSESSMENT/PLAN:  Acute upper respiratory infection - suspect her throat symptoms and cough are related to PND, related to virus (LG fever, URI symptoms). supportive measures reviewed.  Throat soreness - right sided - Plan: Rapid Strep A   Drink plenty of fluids. Use Mucinex or Robitussin Consider also using sudafed or other decongestant.  This will dry up the nose and decrease the drainage down the throat.  Use tylenol or ibuprofen only if needed for pain or fever. If your nasal symptoms improve, but you still have sore throat and/or cough, you can try adding prilosec OTC or zantac or some other antacid, in case there is also a component of reflux contributing to your symptoms. I suspect this is a viral illness (upper respiratory). Consider also trying sinus rinses if the above measures aren't helpful.

## 2016-04-26 NOTE — Patient Instructions (Signed)
  Drink plenty of fluids. Use Mucinex or Robitussin Consider also using sudafed or other decongestant.  This will dry up the nose and decrease the drainage down the throat.  Use tylenol or ibuprofen only if needed for pain or fever. If your nasal symptoms improve, but you still have sore throat and/or cough, you can try adding prilosec OTC or zantac or some other antacid, in case there is also a component of reflux contributing to your symptoms. I suspect this is a viral illness (upper respiratory). Consider also trying sinus rinses if the above measures aren't helpful.

## 2016-05-28 DIAGNOSIS — Z630 Problems in relationship with spouse or partner: Secondary | ICD-10-CM | POA: Diagnosis not present

## 2016-05-28 DIAGNOSIS — F331 Major depressive disorder, recurrent, moderate: Secondary | ICD-10-CM | POA: Diagnosis not present

## 2016-06-04 DIAGNOSIS — F331 Major depressive disorder, recurrent, moderate: Secondary | ICD-10-CM | POA: Diagnosis not present

## 2016-06-04 DIAGNOSIS — Z630 Problems in relationship with spouse or partner: Secondary | ICD-10-CM | POA: Diagnosis not present

## 2016-08-16 ENCOUNTER — Encounter: Payer: Self-pay | Admitting: Family Medicine

## 2016-08-16 ENCOUNTER — Ambulatory Visit (INDEPENDENT_AMBULATORY_CARE_PROVIDER_SITE_OTHER): Payer: BLUE CROSS/BLUE SHIELD | Admitting: Family Medicine

## 2016-08-16 VITALS — BP 112/64 | HR 68 | Temp 98.5°F | Ht 61.0 in | Wt 132.8 lb

## 2016-08-16 DIAGNOSIS — J069 Acute upper respiratory infection, unspecified: Secondary | ICD-10-CM

## 2016-08-16 DIAGNOSIS — J029 Acute pharyngitis, unspecified: Secondary | ICD-10-CM

## 2016-08-16 LAB — POCT RAPID STREP A (OFFICE): RAPID STREP A SCREEN: NEGATIVE

## 2016-08-16 NOTE — Patient Instructions (Signed)
  Drink plenty of fluids Continue mucinex--take twice daily (max doses, if tolerated) Continue decongestants during the day (12 hour sudafed might work better in that it lasts longer--if switching from dayquil to sudafed, you may also use a separate tylenol for fever/pain and delsym if needed for cough; you can continue the Nyquil at night, without taking sudafed). Elevate the head of the bed some; consider doing sinus rinses before bedtime.  Other supportive measures for the sore throat can include salt water gargles, chloraseptic spray, lozenges, etc.  Call or return if symptoms persist/worsen.  Expect to see some improvement within the next 3-5 days. Return if you develop high fevers, shortness of breath, pain with breathing, or other new concerns.

## 2016-08-16 NOTE — Progress Notes (Signed)
Chief Complaint  Patient presents with  . Cough    started this past Monday. Mucus is yellow in color. Had some fevers around 100. Felt somewhat better Tues but worsened last. Kids had hand,foot and mouth last week.-not sure that means anything.    4 days ago she started with a scratchy throat, itchy ears. She has had low grade fevers, up to 100.  3 days ago she felt better, just a little congested.  Yesterday she was much worse.  Last night her throat hurt so badly she could barely swallow.  Yesterday she had a lot of head congestion, today it has moved into her chest.  Nasal drainage was clear.  She is coughing up yellow phlegm.  Denies shortness of breath.  Kids had hand, foot and mouth disease last week. Son also is coughing. Husband is also sick with a similar illness to hers. He started with symptoms 2 days prior to her symptoms.  Yesterday she started using OTC meds (prior to that only took Advil). Took dayquil, nyquil and mucinex yesterday, along with advil this morning.  PMH, PSH, SH reviewed  Outpatient Encounter Prescriptions as of 08/16/2016  Medication Sig Note  . GuaiFENesin (MUCINEX PO) Take 1 tablet by mouth every 4 (four) hours.   Marland Kitchen ibuprofen (ADVIL,MOTRIN) 200 MG tablet Take 400 mg by mouth every 6 (six) hours as needed. 08/16/2016: Last dose 8am  . Pseudoeph-Doxylamine-DM-APAP (NYQUIL PO) Take 30 mLs by mouth at bedtime as needed.   . Pseudoephedrine-APAP-DM (DAYQUIL PO) Take 30 mLs by mouth every 4 (four) hours.   . [DISCONTINUED] DULoxetine (CYMBALTA) 30 MG capsule Take 1 capsule (30 mg total) by mouth daily. (Patient not taking: Reported on 02/08/2016)   . [DISCONTINUED] triamcinolone cream (KENALOG) 0.1 % Apply 1 application topically 2 (two) times daily. (Patient not taking: Reported on 04/26/2016)    No facility-administered encounter medications on file as of 08/16/2016.    No Known Allergies  ROS:  Low grade fevers as per HPI.  No headaches, dizziness, chest pain,  shortness of breath. Denies nausea, vomiting, diarrhea, rashes, body aches.   PHYSICAL EXAM: BP 112/64 (BP Location: Left Arm, Patient Position: Sitting, Cuff Size: Normal)   Pulse 68   Temp 98.5 F (36.9 C) (Tympanic)   Ht 5\' 1"  (1.549 m)   Wt 132 lb 12.8 oz (60.2 kg)   BMI 25.09 kg/m   Pleasant female, with occasional deep cough, in no distress HEENT: PERRL, EOMI, conjunctiva and sclera are clear. TM's and EAC's normal. Nasal mucosa is mild-mod edematous, no erythema or purulence.  Sinuses nontender. OP is clear--moist mucus membranes. Tonsils are normal. No ulcerative or other lesions noted Neck: mild tender shotty anterior cervical lymph nodes Heart: regular rate and rhythm without murmur Lungs: clear bilaterally Extremities: no edema Skin: normal turgor, no rash Neuro: alert and oriented, cranial nerves intact, normal strength, gait Psych: normal mood, affect, hygiene and grooming  Negative rapid strep   ASSESSMENT/PLAN:  Acute upper respiratory infection  Sore throat - Plan: Rapid Strep A  Supportive measures reviewed S/sx of bacterial infection reviewed F/u if symptoms persist/worsen.    Drink plenty of fluids Continue mucinex--take twice daily (max doses, if tolerated) Continue decongestants during the day (12 hour sudafed might work better in that it lasts longer--if switching from dayquil to sudafed, you may also use a separate tylenol for fever/pain and delsym if needed for cough; you can continue the Nyquil at night, without taking sudafed). Elevate the head of the  bed some; consider doing sinus rinses before bedtime.  Other supportive measures for the sore throat can include salt water gargles, chloraseptic spray, lozenges, etc.  Call or return if symptoms persist/worsen.  Expect to see some improvement within the next 3-5 days. Return if you develop high fevers, shortness of breath, pain with breathing, or other new concerns.

## 2016-08-18 ENCOUNTER — Ambulatory Visit: Payer: BLUE CROSS/BLUE SHIELD

## 2016-08-23 ENCOUNTER — Ambulatory Visit (INDEPENDENT_AMBULATORY_CARE_PROVIDER_SITE_OTHER): Payer: BLUE CROSS/BLUE SHIELD | Admitting: Family Medicine

## 2016-08-23 ENCOUNTER — Ambulatory Visit: Payer: BLUE CROSS/BLUE SHIELD | Admitting: Family Medicine

## 2016-08-23 ENCOUNTER — Encounter: Payer: Self-pay | Admitting: Family Medicine

## 2016-08-23 VITALS — BP 130/80 | HR 72 | Temp 99.4°F | Ht 61.0 in | Wt 132.0 lb

## 2016-08-23 DIAGNOSIS — J0100 Acute maxillary sinusitis, unspecified: Secondary | ICD-10-CM

## 2016-08-23 MED ORDER — AMOXICILLIN-POT CLAVULANATE 875-125 MG PO TABS: 1.0000 | ORAL_TABLET | Freq: Two times a day (BID) | ORAL | 0 refills | Status: DC

## 2016-08-23 NOTE — Progress Notes (Signed)
Chief Complaint  Patient presents with  . Facial Pain    and pressure very badly. Since Monday has had bright green and yellow to bloody mucus and has had the sweats so thinks she may have had some fevers. No body aches or coughing.    Seen last week with what was felt to be a viral URI.  She was encouraged to use supportive measures, OTC medications.  She had gotten better, but then 3 days ago she suddenly got much worse. Complaining of facial pain, discolored nasal drainage (neon and dark green, brown/bloody).  Complaining of pain in both cheeks and into her upper teeth.  Using sudafed, advil helps some during the day.  Mucus is very thick. Hasn't been using mucinex.  Didn't sleep well last night due to pain.  Outpatient Encounter Prescriptions as of 08/23/2016  Medication Sig  . ibuprofen (ADVIL,MOTRIN) 200 MG tablet Take 400 mg by mouth every 6 (six) hours as needed.  . pseudoephedrine (SUDAFED) 120 MG 12 hr tablet Take 120 mg by mouth 2 (two) times daily.  Marland Kitchen amoxicillin-clavulanate (AUGMENTIN) 875-125 MG tablet Take 1 tablet by mouth 2 (two) times daily.  . [DISCONTINUED] GuaiFENesin (MUCINEX PO) Take 1 tablet by mouth every 4 (four) hours.  . [DISCONTINUED] Pseudoeph-Doxylamine-DM-APAP (NYQUIL PO) Take 30 mLs by mouth at bedtime as needed.  . [DISCONTINUED] Pseudoephedrine-APAP-DM (DAYQUIL PO) Take 30 mLs by mouth every 4 (four) hours.   No facility-administered encounter medications on file as of 08/23/2016.    (augmentin rx'd today, not prior to visit).  No Known Allergies  ROS: no known fever/chills. Denies cough, shortness of breath, wheezing. URI symptoms per HPI. No vaginal discharge, urinary complaints, joint pains. No bleeding, bruising, rash.  PHYSICAL EXAM:  BP 130/80 (BP Location: Left Arm, Patient Position: Sitting, Cuff Size: Normal)   Pulse 72   Temp 99.4 F (37.4 C) (Tympanic)   Ht '5\' 1"'  (1.549 m)   Wt 132 lb (59.9 kg)   LMP 08/09/2016   BMI 24.94 kg/m     Well developed, pleasant female in no acute distress HEENT: PERRL, EOMI, conjunctiva and sclera are clear. TM's and EAC's normal. Nasal mucosa is moderately edematous, no erythema or purulence noted. Tender over bilateral maxillary sinuses. OP is clear Neck:  Shotty anterior cervical lymphadenopathy, nontender Heart: regular rate and rhythm without murmur Lungs: clear bilaterally Skin: normal turgor, no rash Psych: normal mood, affect, hygiene and grooming Neuro: alert and oriented, cranial nerves intact, normal gait, strength  ASSESSMENT/PLAN:  Acute non-recurrent maxillary sinusitis - Plan: amoxicillin-clavulanate (AUGMENTIN) 875-125 MG tablet    Take the antibiotic twice daily as directed. Call on the last day if you aren't 100% better to have the course lengthened. Call sooner if symptoms aren't improving at all (or worsening)--may take 3-5 days to see significant improvement. Add back the mucinex 1230m twice daily Continue the sudafed I recommend trying sinus rinses (rinse kit or Neti-pot) to help with the sinus pain/pressure while waiting for the antibiotics to be effective. Call (or try OTC Monistat) if you develop any vaginal itching and discharge (yeast infection symptoms). If you get diarrhea from the antibiotics, use probiotics.

## 2016-08-23 NOTE — Patient Instructions (Signed)
  Take the antibiotic twice daily as directed. Call on the last day if you aren't 100% better to have the course lengthened. Call sooner if symptoms aren't improving at all (or worsening)--may take 3-5 days to see significant improvement. Add back the mucinex 1237m twice daily Continue the sudafed I recommend trying sinus rinses (rinse kit or Neti-pot) to help with the sinus pain/pressure while waiting for the antibiotics to be effective. Call (or try OTC Monistat) if you develop any vaginal itching and discharge (yeast infection symptoms). If you get diarrhea from the antibiotics, use probiotics.

## 2016-11-30 DIAGNOSIS — Z3A24 24 weeks gestation of pregnancy: Secondary | ICD-10-CM | POA: Diagnosis not present

## 2016-11-30 DIAGNOSIS — Z13220 Encounter for screening for lipoid disorders: Secondary | ICD-10-CM | POA: Diagnosis not present

## 2016-11-30 DIAGNOSIS — Z01419 Encounter for gynecological examination (general) (routine) without abnormal findings: Secondary | ICD-10-CM | POA: Diagnosis not present

## 2016-11-30 LAB — HM PAP SMEAR

## 2017-03-26 DIAGNOSIS — D225 Melanocytic nevi of trunk: Secondary | ICD-10-CM | POA: Diagnosis not present

## 2017-03-26 DIAGNOSIS — L309 Dermatitis, unspecified: Secondary | ICD-10-CM | POA: Diagnosis not present

## 2017-03-26 DIAGNOSIS — L218 Other seborrheic dermatitis: Secondary | ICD-10-CM | POA: Diagnosis not present

## 2017-03-26 DIAGNOSIS — L814 Other melanin hyperpigmentation: Secondary | ICD-10-CM | POA: Diagnosis not present

## 2017-06-03 DIAGNOSIS — F330 Major depressive disorder, recurrent, mild: Secondary | ICD-10-CM | POA: Diagnosis not present

## 2017-07-01 DIAGNOSIS — F330 Major depressive disorder, recurrent, mild: Secondary | ICD-10-CM | POA: Diagnosis not present

## 2017-07-29 DIAGNOSIS — F330 Major depressive disorder, recurrent, mild: Secondary | ICD-10-CM | POA: Diagnosis not present

## 2017-09-21 IMAGING — CT CT CHEST W/O CM
1 series · 15 of 33 positions shown, 19 images · non-contrast
Comparison: No prior chest CT.  Two-view chest x-ray 08/06/2012.

CLINICAL DATA: 34-year-old presenting with 1-2 month history of
right posterior chest pain and shortness of breath. Family history
of lung cancer in her mother. Personal history of HPV infection.

EXAM:
CT CHEST WITHOUT CONTRAST
TECHNIQUE: Multidetector CT imaging of the chest was performed following the
standard protocol without IV contrast.

[Series 2: chest w/(date) · axial · 0.70mm/px · z∈[-327,-62]mm · 15 of 63 slices shown, 19 images]
[im 5/63  mediastinal]
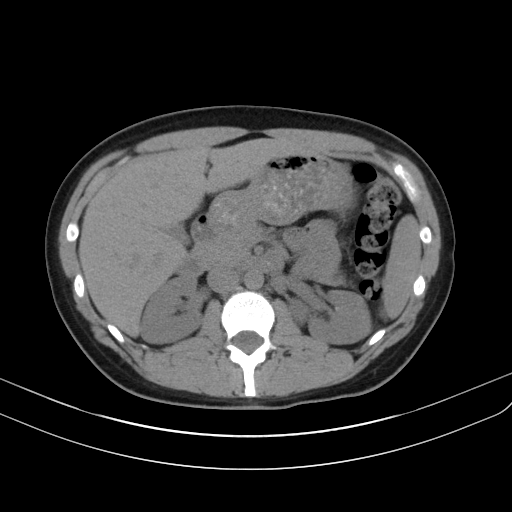
[im 5/63  lung]
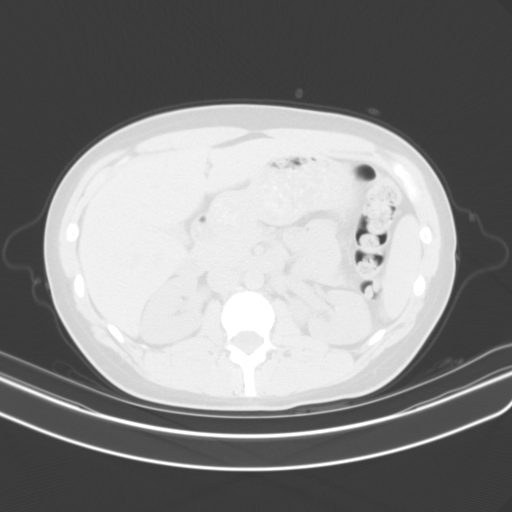
[im 10/63  lung]
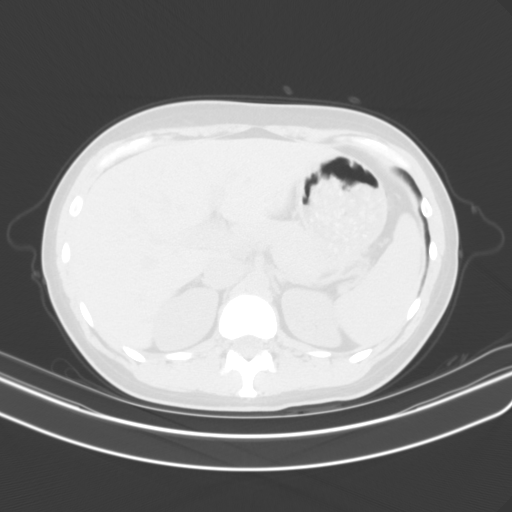
[im 13/63  lung]
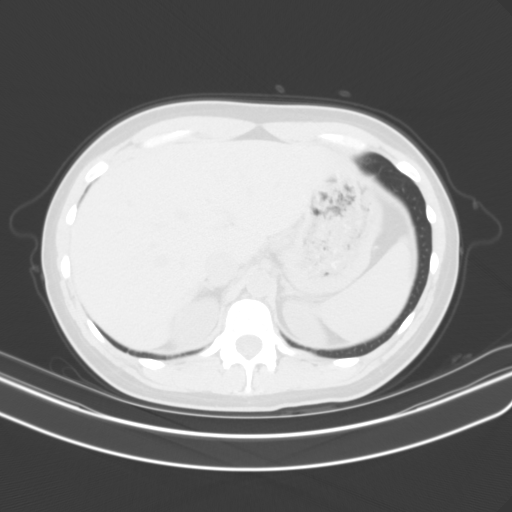
[im 17/63  lung]
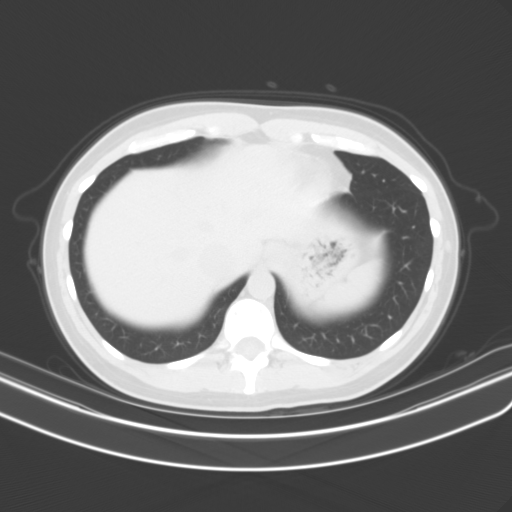
[im 21/63  mediastinal]
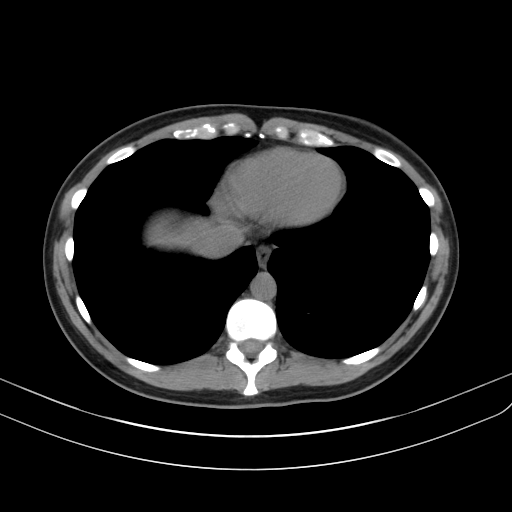
[im 21/63  lung]
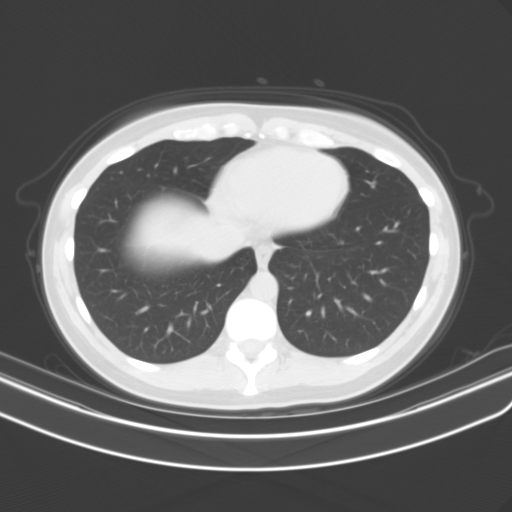
[im 25/63  lung]
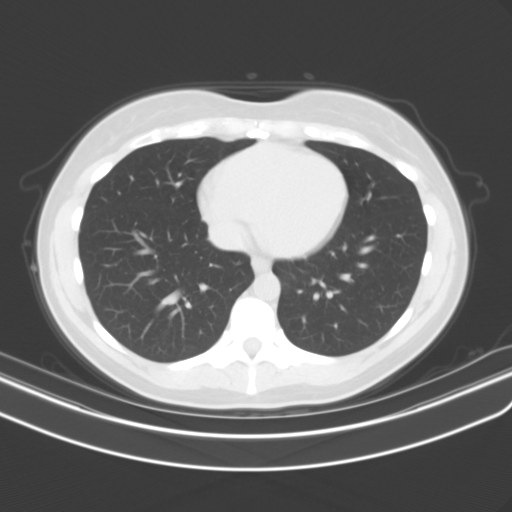
[im 28/63  lung]
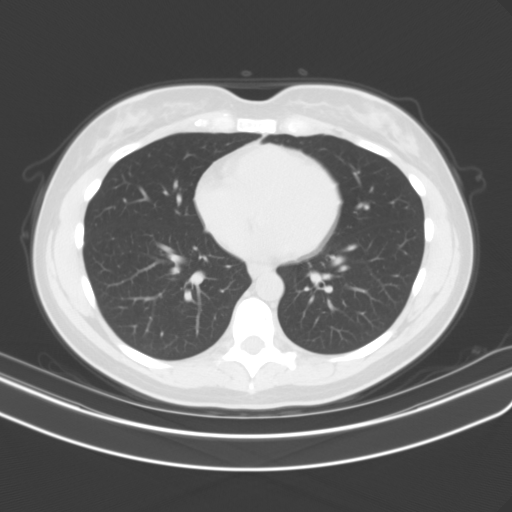
[im 33/63  lung]
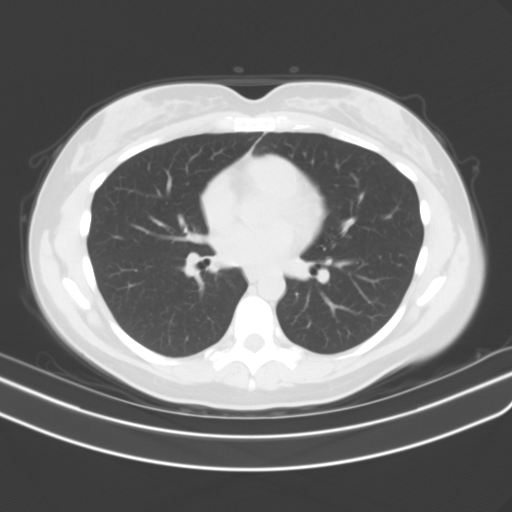
[im 35/63  mediastinal]
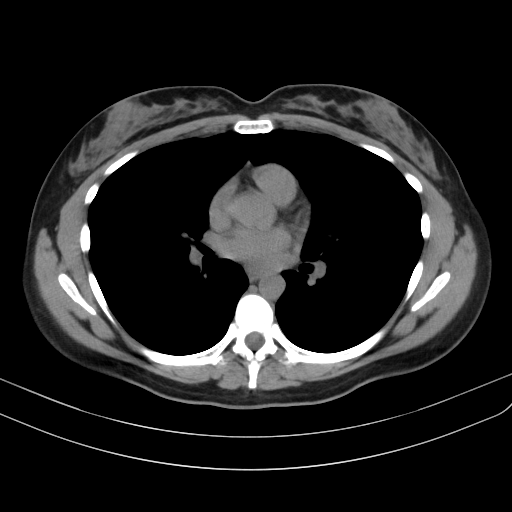
[im 35/63  lung]
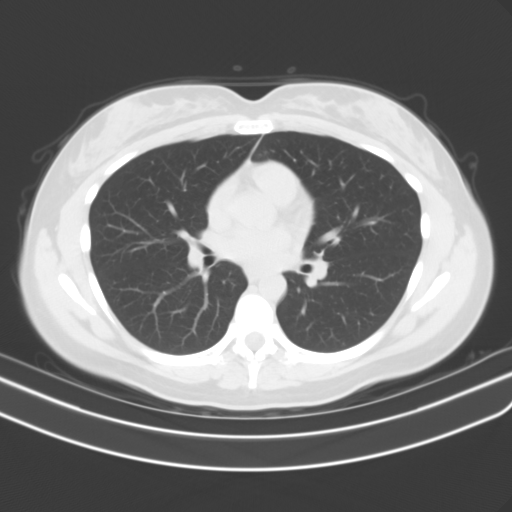
[im 38/63  lung]
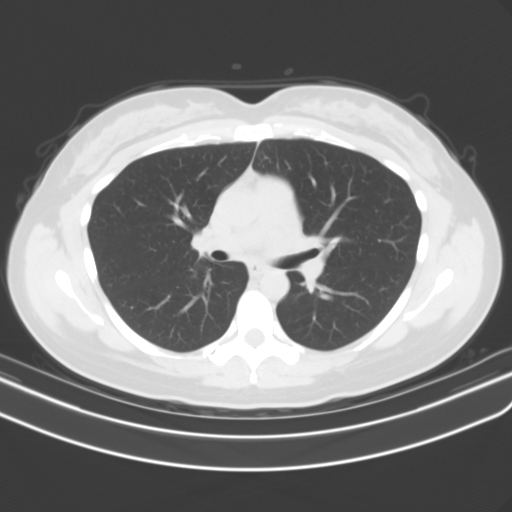
[im 42/63  lung]
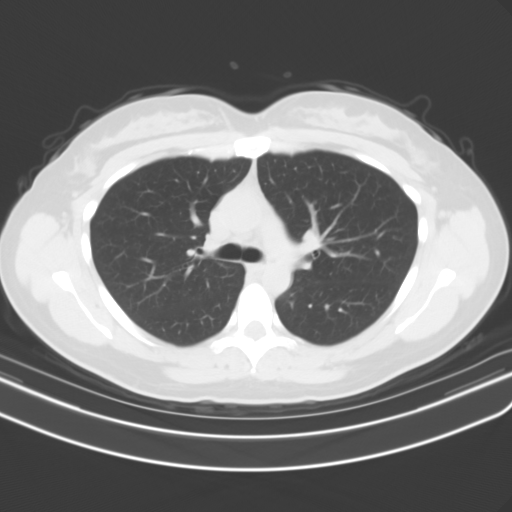
[im 46/63  lung]
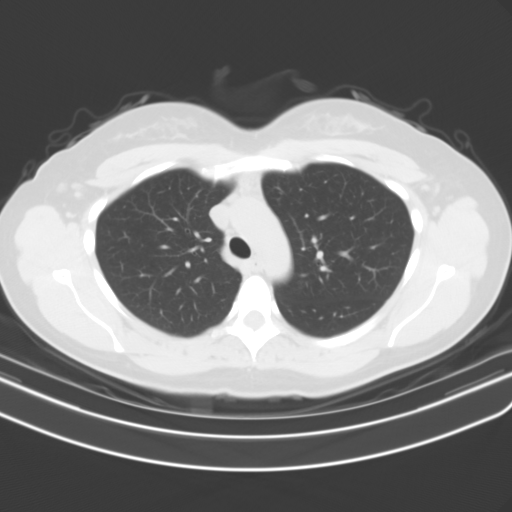
[im 50/63  mediastinal]
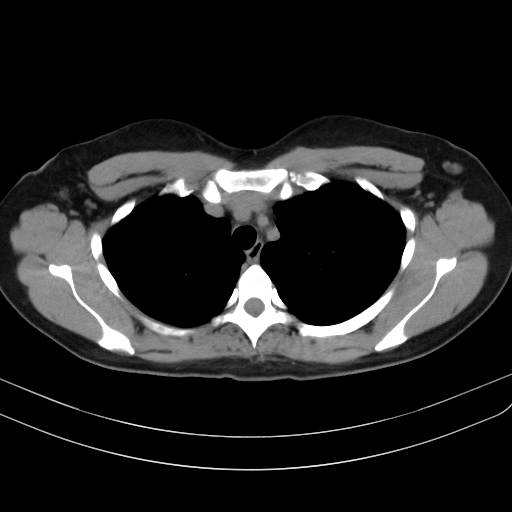
[im 50/63  lung]
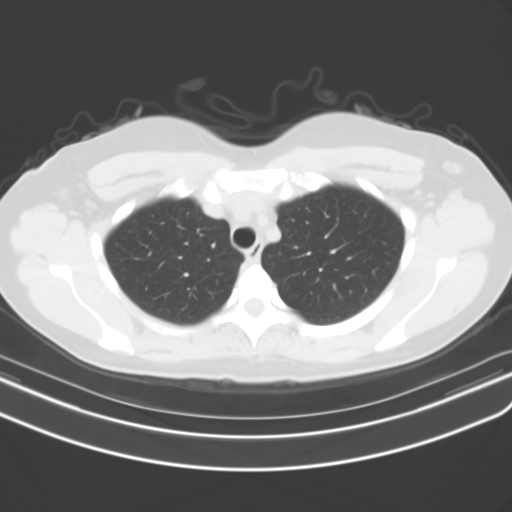
[im 53/63  lung]
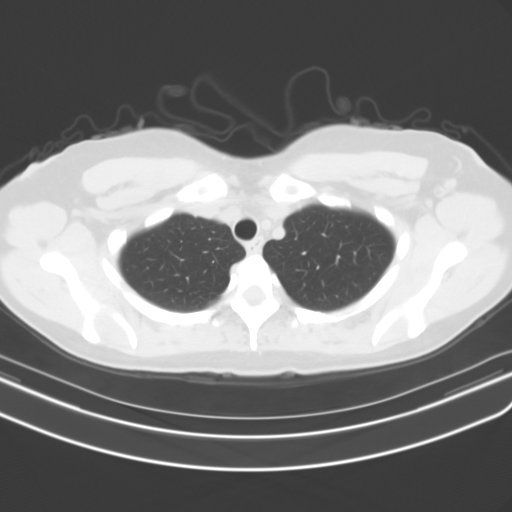
[im 58/63  lung]
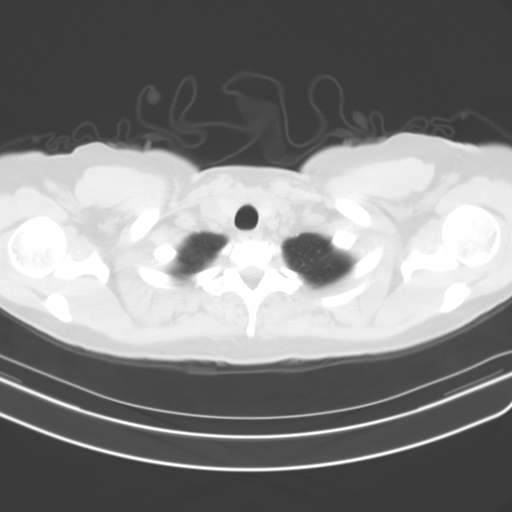

[15 of 33 positions shown; findings below may reference images not displayed]

FINDINGS: Cardiovascular: Normal heart size. No pericardial effusion. No
visible coronary atherosclerosis. No visible atherosclerosis
involving the thoracic or upper abdominal aorta or the proximal
great vessels.

Mediastinum/Lymph Nodes: No pathologically enlarged mediastinal,
hilar or axillary lymph nodes. No mediastinal masses.
Normal-appearing esophagus. Residual thymic tissue in the anterior
superior mediastinum. Visualized thyroid gland unremarkable.

Lungs/Pleura: Pulmonary parenchyma clear without localized airspace
consolidation, interstitial disease, or parenchymal nodules or
masses. Central airways patent without significant bronchial wall
thickening. No pleural effusions. No pleural plaques or masses.

Upper abdomen: Normal unenhanced appearance.

Musculoskeletal: Regional skeleton intact without acute or
significant osseous abnormality.
IMPRESSION: Normal examination.

## 2017-12-07 ENCOUNTER — Ambulatory Visit: Payer: BLUE CROSS/BLUE SHIELD | Admitting: Physician Assistant

## 2017-12-07 DIAGNOSIS — J029 Acute pharyngitis, unspecified: Secondary | ICD-10-CM | POA: Diagnosis not present

## 2018-01-02 ENCOUNTER — Ambulatory Visit: Payer: BLUE CROSS/BLUE SHIELD | Admitting: Family Medicine

## 2018-01-02 ENCOUNTER — Encounter: Payer: Self-pay | Admitting: Family Medicine

## 2018-01-02 VITALS — BP 110/70 | HR 74 | Temp 98.5°F | Resp 16 | Wt 127.8 lb

## 2018-01-02 DIAGNOSIS — R05 Cough: Secondary | ICD-10-CM

## 2018-01-02 DIAGNOSIS — R058 Other specified cough: Secondary | ICD-10-CM

## 2018-01-02 MED ORDER — AZITHROMYCIN 250 MG PO TABS: ORAL_TABLET | ORAL | 0 refills | Status: DC

## 2018-01-02 MED ORDER — BENZONATATE 200 MG PO CAPS: 200.0000 mg | ORAL_CAPSULE | Freq: Two times a day (BID) | ORAL | 0 refills | Status: DC | PRN

## 2018-01-02 NOTE — Progress Notes (Signed)
Chief Complaint  Patient presents with  . cough    cough, chest congestion, mucous is green. had flu last friday but had cough since monday    Subjective:  Katherine Bowman is a 37 y.o. female who presents for a productive cough x 3 days after having the flu last week. States she was not seen last week, she self diagnosed based on her symptoms and her daughter being diagnosed with influenza. States she took Tamiflu because her daughter was prescribed "extra". States she had fever, chills, body aches, sore throat, headache last week. Cough started 3 days ago. Denies shortness of breath or wheezing. She is also having post nasal drainage but no sinus pain, nasal congestion or rhinorrhea.   States last fever was 4 days ago.  Denies ear pain, dizziness, chest pain, palpitations, abdominal pain, N/V/D.   LMP: 12/20/2017  Treatment to date: cough suppressants, decongestants and Tamiflu.  Positive sick contacts.  No other aggravating or relieving factors.  No other c/o.  ROS as in subjective.   Objective: Vitals:   01/02/18 1004  BP: 110/70  Pulse: 74  Resp: 16  Temp: 98.5 F (36.9 C)  SpO2: 97%    General appearance: Alert, WD/WN, no distress, mildly ill appearing                             Skin: warm, no rash                           Head: no sinus tenderness                            Eyes: conjunctiva normal, corneas clear, PERRLA                            Ears: pearly TMs, external ear canals normal                          Nose: septum midline, turbinates swollen, with erythema and clear discharge             Mouth/throat: MMM, tongue normal, mild pharyngeal erythema                           Neck: supple, no adenopathy, no thyromegaly, nontender                          Heart: RRR, normal S1, S2, no murmurs                         Lungs: CTA bilaterally, no wheezes, rales, or rhonchi, normal work of breathing      Assessment: Productive cough   Plan: She self diagnosed  influenza last week based on her symptoms and her daughter being diagnosed. She did take Tamiflu and unclear on the dose.  Vitals are wnl. No obvious infectious process.  Discussed diagnosis and treatment of cough following a viral illness such as the flu.  Z-pak prescribed with instructions to hold off for the next 2 days and see if she turns the corner on her own. If she worsens or is not improving by the weekend she will start the antibiotic. Tessalon also prescribed. Suggested symptomatic  OTC remedies.Nasal saline spray for congestion.  Tylenol or Ibuprofen OTC for fever and malaise.  Call/return if symptoms worsen or if she is not back to baseline after completing the antibiotic. She is aware that the cough may linger.

## 2018-01-02 NOTE — Patient Instructions (Signed)
Continue taking Mucinex DM and drink plenty of water. You can try the Tessalon also.   If you are not turning the corner then start the antibiotic.   If your symptoms get worse, call and let us know.

## 2018-04-18 DIAGNOSIS — J029 Acute pharyngitis, unspecified: Secondary | ICD-10-CM | POA: Diagnosis not present

## 2018-05-06 DIAGNOSIS — Z1151 Encounter for screening for human papillomavirus (HPV): Secondary | ICD-10-CM | POA: Diagnosis not present

## 2018-05-06 DIAGNOSIS — Z6823 Body mass index (BMI) 23.0-23.9, adult: Secondary | ICD-10-CM | POA: Diagnosis not present

## 2018-05-06 DIAGNOSIS — Z01419 Encounter for gynecological examination (general) (routine) without abnormal findings: Secondary | ICD-10-CM | POA: Diagnosis not present

## 2018-05-31 DIAGNOSIS — J020 Streptococcal pharyngitis: Secondary | ICD-10-CM | POA: Diagnosis not present

## 2018-05-31 DIAGNOSIS — J029 Acute pharyngitis, unspecified: Secondary | ICD-10-CM | POA: Diagnosis not present

## 2018-07-24 ENCOUNTER — Encounter: Payer: Self-pay | Admitting: Medical

## 2018-07-24 ENCOUNTER — Ambulatory Visit: Payer: BLUE CROSS/BLUE SHIELD | Admitting: Medical

## 2018-07-24 VITALS — BP 110/70 | HR 60 | Temp 97.7°F | Resp 16 | Ht 62.0 in | Wt 126.4 lb

## 2018-07-24 DIAGNOSIS — H109 Unspecified conjunctivitis: Secondary | ICD-10-CM

## 2018-07-24 DIAGNOSIS — H01002 Unspecified blepharitis right lower eyelid: Secondary | ICD-10-CM

## 2018-07-24 MED ORDER — POLYMYXIN B-TRIMETHOPRIM 10000-0.1 UNIT/ML-% OP SOLN: 1.0000 [drp] | OPHTHALMIC | 0 refills | Status: DC

## 2018-07-24 NOTE — Patient Instructions (Signed)
Thank you for giving me the opportunity to serve you today.    Your diagnosis today includes: No diagnosis found.   Specific recommendations today include:                                                          Pink eye is very contagious and spreads by direct contact.    You may be given antibiotic eyedrops as part of your treatment. Before using your eye medicine, remove all drainage from the eye by washing gently with warm water and cotton balls.   Continue to use the medication until you have awakened 2 mornings in a row without discharge from the eye.   Do not rub your eye. This increases the irritation and helps spread infection.   Use separate towels from other household members.   Wash your hands with soap and water before and after touching your eyes.   Use cold compresses to reduce pain and sunglasses to relieve irritation from light.  Do not wear contact lenses or wear eye makeup until the infection is gone.  Call or return if worse or not seeing improvement in the next few days.   Medication costs:  If you get to the pharmacy and medication prescribed today was either too expensive, not covered by your insurance, or required prior authorization, then please call us back to let us know.  We often have no way to know if a medication is too expensive or not covered by your insurance.  Thanks for your cooperation.    I have included other useful information below for your review.   Bacterial Conjunctivitis Bacterial conjunctivitis (commonly called pink eye) is redness, soreness, or puffiness (inflammation) of the white part of your eye. It is caused by a germ called bacteria. These germs can easily spread from person to person (contagious). Your eye often will become red or pink. Your eye may also become irritated, watery, or have a thick discharge.  HOME CARE   Apply a cool, clean washcloth over closed eyelids. Do this for 10-20 minutes, 3-4 times a day while you  have pain.  Gently wipe away any fluid coming from the eye with a warm, wet washcloth or cotton ball.  Wash your hands often with soap and water. Use paper towels to dry your hands.  Do not share towels or washcloths.  Change or wash your pillowcase every day.  Do not use eye makeup until the infection is gone.  Do not use machines or drive if your vision is blurry.  Stop using contact lenses. Do not use them again until your doctor says it is okay.  Do not touch the tip of the eye drop bottle or medicine tube with your fingers when you put medicine on the eye. GET HELP RIGHT AWAY IF:   Your eye is not better after 3 days of starting your medicine.  You have a yellowish fluid coming out of the eye.  You have more pain in the eye.  Your eye redness is spreading.  Your vision becomes blurry.  You have a fever or lasting symptoms for more than 2-3 days.  You have a fever and your symptoms suddenly get worse.  You have pain in the face.  Your face gets red or puffy (swollen). MAKE SURE YOU:  Understand these instructions.  Will watch this condition.  Will get help right away if you are not doing well or get worse. Document Released: 07/31/2008 Document Revised: 10/08/2012 Document Reviewed: 06/27/2012 [facility] Patient Information 2015 [place], [place]. This information is not intended to replace advice given to you by your health care provider. Make sure you discuss any questions you have with your health care provider.    Viral Conjunctivitis Conjunctivitis is an irritation (inflammation) of the clear membrane that covers the white part of the eye (the conjunctiva). The irritation can also happen on the underside of the eyelids. Conjunctivitis makes the eye red or pink in color. This is what is commonly known as pink eye. Viral conjunctivitis can spread easily (contagious). CAUSES   Infection from virus on the surface of the eye.  Infection from the irritation or  injury of nearby tissues such as the eyelids or cornea.  More serious inflammation or infection on the inside of the eye.  Other eye diseases.  The use of certain eye medications. SYMPTOMS  The normally white color of the eye or the underside of the eyelid is usually pink or red in color. The pink eye is usually associated with irritation, tearing and some sensitivity to light. Viral conjunctivitis is often associated with a clear, watery discharge. If a discharge is present, there may also be some blurred vision in the affected eye. DIAGNOSIS  Conjunctivitis is diagnosed by an eye exam. The eye specialist looks for changes in the surface tissues of the eye which take on changes characteristic of the specific types of conjunctivitis. A sample of any discharge may be collected on a Q-Tip (sterile swap). The sample will be sent to a lab to see whether or not the inflammation is caused by bacterial or viral infection. TREATMENT  Viral conjunctivitis will not respond to medicines that kill germs (antibiotics). Treatment is aimed at stopping a bacterial infection on top of the viral infection. The goal of treatment is to relieve symptoms (such as itching) with antihistamine drops or other eye medications.  HOME CARE INSTRUCTIONS   To ease discomfort, apply a cool, clean wash cloth to your eye for 10 to 20 minutes, 3 to 4 times a day.  Gently wipe away any drainage from the eye with a warm, wet washcloth or a cotton ball.  Wash your hands often with soap and use paper towels to dry.  Do not share towels or washcloths. This may spread the infection.  Change or wash your pillowcase every day.  You should not use eye make-up until the infection is gone.  Stop using contacts lenses. Ask your eye professional how to sterilize or replace them before using again. This depends on the type of contact lenses used.  Do not touch the edge of the eyelid with the eye drop bottle or ointment tube when  applying medications to the affected eye. This will stop you from spreading the infection to the other eye or to others. SEEK IMMEDIATE MEDICAL CARE IF:   The infection has not improved within 3 days of beginning treatment.  A watery discharge from the eye develops.  Pain in the eye increases.  The redness is spreading.  Vision becomes blurred.  An oral temperature above 102 F (38.9 C) develops, or as your caregiver suggests.  Facial pain, redness or swelling develops.  Any problems that may be related to the prescribed medicine develop. MAKE SURE YOU:   Understand these instructions.  Will watch your condition.  Will get help right away if you are not doing well or get worse. Document Released: 10/22/2005 Document Revised: 01/14/2012 Document Reviewed: 06/10/2008 [facility] Patient Information 2015 [place], [place]. This information is not intended to replace advice given to you by your health care provider. Make sure you discuss any questions you have with your health care provider.

## 2018-07-24 NOTE — Progress Notes (Signed)
Subjective Chief Complaint  Patient presents with  . right eye    right eye puffy, itchy, red X 1 day   Here for right eye puffiness, itching x 1 days.  Wants to make sure not pink eye.   Did cold compress this morning.  No drainage, not a lot of redness or crusting but it is itching.   No sick contacts with pink eye.   She does help in the classroom at school.   No concern for injury or debris in eye.   No cold or URI symptoms, no vision loss or vision changes. No eye pain.  No other aggravating or relieving factors. No other complaint.   Past Medical History:  Diagnosis Date  . History of HPV infection   . PP care - s/ 1C/S 4/28 (twins PPROM 33.6 wks) 03/03/2012   No current outpatient medications on file prior to visit.   No current facility-administered medications on file prior to visit.    ROS as in subjective    Objective: BP 110/70   Pulse 60   Temp 97.7 F (36.5 C) (Oral)   Resp 16   Ht 5\' 2"  (1.575 m)   Wt 126 lb 6.4 oz (57.3 kg)   LMP 07/11/2018 (Approximate)   SpO2 99%   BMI 23.12 kg/m   General appearance: alert, no distress, WD/WN,  Right lower eyelid with mild swelling and pink coloration, slight conjunctiva injection, otherwise eyelid and eyes normal appearing, PERRLA, EOMi HEENT: normocephalic, nares patent, no discharge or erythema, pharynx normal Oral cavity: MMM, no lesions Neck: supple, no lymphadenopathy, no thyromegaly, no masses     Assessment: Encounter Diagnoses  Name Primary?  . Conjunctivitis of right eye, unspecified conjunctivitis type Yes  . Blepharitis of right lower eyelid, unspecified type     Plan: Symptoms and exam suggest early blepharitits/conjunctivitis.    Discussed diagnosis of conjunctivitis/pink eye.  Advised that pink eye is very contagious and spreads by direct contact.  Discussed treatment including moist compresses, avoid rubbing eyes, do not wear contact lenses or makeup until infection is resolved.  Discussed  prevention, hand washing, not rubbing eyes.     Patient was advised to call or return if worse or not improving in the next few days.    Patient voiced understanding of diagnosis, recommendations, and treatment plan.  After visit summary given.   Maralyn SagoSarah was seen today for right eye.  Diagnoses and all orders for this visit:  Conjunctivitis of right eye, unspecified conjunctivitis type  Blepharitis of right lower eyelid, unspecified type  Other orders -     trimethoprim-polymyxin b (POLYTRIM) ophthalmic solution; Place 1 drop into the right eye every 4 (four) hours.

## 2018-08-05 ENCOUNTER — Ambulatory Visit: Payer: BLUE CROSS/BLUE SHIELD | Admitting: Medical

## 2018-08-05 VITALS — BP 126/72 | HR 78 | Temp 98.1°F | Resp 16 | Ht 62.0 in | Wt 126.0 lb

## 2018-08-05 DIAGNOSIS — L259 Unspecified contact dermatitis, unspecified cause: Secondary | ICD-10-CM | POA: Diagnosis not present

## 2018-08-05 DIAGNOSIS — H029 Unspecified disorder of eyelid: Secondary | ICD-10-CM

## 2018-08-05 NOTE — Progress Notes (Signed)
Subjective: Chief Complaint  Patient presents with  . eye issue    eye issue skin   Here for red swollen eyelids.   I saw her recently for early conjunctivitis which never worsened or cleared up despite using Polytrim drops.   She now thinks the symptoms represent more of a contact dermatitis.   The last several days has had swollen red eyelids , wakes up with them this way, and they are staying irritated.   Has tried allergy eye drops with no improvement.  Took Claritin some and has stopped the Polytrim.   No purulent discharge.  No other URI symptoms.   No fever.  No prior similar.   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   No other aggravating or relieving factors. No other complaint.   Past Medical History:  Diagnosis Date  . History of HPV infection   . PP care - s/ 1C/S 4/28 (twins PPROM 33.6 wks) 03/03/2012   Current Outpatient Medications on File Prior to Visit  Medication Sig Dispense Refill  . trimethoprim-polymyxin b (POLYTRIM) ophthalmic solution Place 1 drop into the right eye every 4 (four) hours. 10 mL 0   No current facility-administered medications on file prior to visit.    ROS as in subjective   Objective: BP 126/72   Pulse 78   Temp 98.1 F (36.7 C) (Oral)   Resp 16   Ht 5\' 2"  (1.575 m)   Wt 126 lb (57.2 kg)   LMP 07/11/2018 (Approximate)   SpO2 98%   BMI 23.05 kg/m   Gen: wd, wn, nad Skin: mild pink/red coloration of distal border of eyelids bilat, worse upper, otherwise eye lids and orbital area unremarkable appearance Eyes PERRLA, EOMi, conjunctiva pink No other obvious rash or abnormality    Assessment: Encounter Diagnoses  Name Primary?  . Contact dermatitis, unspecified contact dermatitis type, unspecified trigger Yes  . Eyelid abnormality      Plan: Exam with mild findings.   Advised she begin benadryl oral 25mg  QHS, can use claritin in the moorings.  Begin OTC  hydrocortisone cream to the eyelids BID for the next 4-5 days.   If not seeing some improvement in 48 hours call back.    She has already stopped Polytrim drops.    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  She can call dermatology and see if they feel safe for her to use this in lieu of the fluticasone cream, otherwise stick with fluticasone.     Advised 48 hour call back  Neelie was seen today for eye issue.  Diagnoses and all orders for this visit:  Contact dermatitis, unspecified contact dermatitis type, unspecified trigger  Eyelid abnormality

## 2018-09-19 ENCOUNTER — Other Ambulatory Visit: Payer: BLUE CROSS/BLUE SHIELD

## 2018-10-16 ENCOUNTER — Encounter: Payer: Self-pay | Admitting: Family Medicine

## 2018-10-16 ENCOUNTER — Ambulatory Visit: Payer: BLUE CROSS/BLUE SHIELD | Admitting: Family Medicine

## 2018-10-16 VITALS — BP 96/58 | HR 64 | Temp 98.3°F | Ht 61.0 in | Wt 126.8 lb

## 2018-10-16 DIAGNOSIS — J069 Acute upper respiratory infection, unspecified: Secondary | ICD-10-CM | POA: Diagnosis not present

## 2018-10-16 DIAGNOSIS — R509 Fever, unspecified: Secondary | ICD-10-CM

## 2018-10-16 DIAGNOSIS — J029 Acute pharyngitis, unspecified: Secondary | ICD-10-CM | POA: Diagnosis not present

## 2018-10-16 LAB — POC INFLUENZA A&B (BINAX/QUICKVUE)
INFLUENZA B, POC: NEGATIVE
Influenza A, POC: NEGATIVE

## 2018-10-16 LAB — POCT RAPID STREP A (OFFICE): Rapid Strep A Screen: NEGATIVE

## 2018-10-16 NOTE — Progress Notes (Signed)
Chief Complaint  Patient presents with  . Sore Throat    started as a tickle yesterday afternoon. Worsened overnight. Dry, scratchy cough but slight. Temps around 100 and HA. Sweating but no chills or body aches. Patient requesting to be tested for strep and flu due to holidays and having family in town. (I will do strep first and flu if negative)   Yesterday started with tickle in throat and ears.  Woke up last night with full blown sore throat.  This morning it was worse, with headache posteriorly and frontally as well.  She has had low grade fevers (100), tired, slightly achey.  Just started coughing recently. Nasal drainage is not discolored.   Has taken ibuprofen and dayquil today. She slept most of the day, but the OTC meds helped some. Still has some aching in her ears and throat, but it helped ease it some.  No known sick contacts.  PMH, PSH, SH reviewed  Outpatient Encounter Medications as of 10/16/2018  Medication Sig Note  . ibuprofen (ADVIL,MOTRIN) 200 MG tablet Take 600 mg by mouth every 6 (six) hours as needed. 10/16/2018: Last dose 12:30pm  . Pseudoephedrine-APAP-DM (DAYQUIL PO) Take 30 mLs by mouth as needed. 10/16/2018: Last dose 9am  . [DISCONTINUED] trimethoprim-polymyxin b (POLYTRIM) ophthalmic solution Place 1 drop into the right eye every 4 (four) hours.    No facility-administered encounter medications on file as of 10/16/2018.    No Known Allergies  ROS: +LG fever, sore throat, runny nose and headache per HPI.  No nausea, vomiting, diarrhea, rashes, bleeding, bruising. No urinary complaints, chest pain, shortness of breath or other complaints.  PHYSICAL EXAM:  BP (!) 96/58   Pulse 64   Temp 98.3 F (36.8 C) (Tympanic)   Ht 5\' 1"  (1.549 m)   Wt 126 lb 12.8 oz (57.5 kg)   LMP 10/06/2018 (Approximate)   BMI 23.96 kg/m   Well-appearing, pleasant female, in no distress HEENT: Conjunctiva and sclera are clear, EOMI. TM's and EAC's normal. Nasal mucosa is  moderately edematous, clear mucus, just slight crusting. OP is clear, slight cobblestoning posteriorly Sinuses are nontender Neck: Tender, shotty anterior cervical lymphadenopathy bilaterally Heart: regular rate and rhythm, no murmur Lungs clear bilaterally Skin: no rash, normal turgor Neuro: alert and oriented, cranial nerves intact, normal gait Psych: normal mood, affect, hygiene and grooming  Rapid strep negative Influenza tests negative for A&B    ASSESSMENT/PLAN:  Viral upper respiratory infection - no e/o bacterial infection noted. Supportive measures reviewed, and s/sx secondary infections  Sore throat - Plan: Rapid Strep A, POC Influenza A&B (Binax test)  Fever, unspecified fever cause - Plan: POC Influenza A&B (Binax test)    Drink plenty of water. Continue decongestant (Dayquil vs sudafed) If Dayquil doesn't contain Guaifenesin (I don't think it does), then add Guaifenesin (I recommend the 12 hour plain mucinex). Consider sinus rinses if worsening sinus pain. Continue advil as needed for pain. Get plenty of rest.   Contact us if fever persists or worsens, if you develop chest pain, pain with breathing or shortness of breath.

## 2018-10-16 NOTE — Patient Instructions (Signed)
  Drink plenty of water. Continue decongestant (Dayquil vs sudafed) If Dayquil doesn't contain Guaifenesin (I don't think it does), then add Guaifenesin (I recommend the 12 hour plain mucinex). Consider sinus rinses if worsening sinus pain. Continue advil as needed for pain. Get plenty of rest.   Contact us if fever persists or worsens, if you develop chest pain, pain with breathing or shortness of breath.

## 2018-10-24 DIAGNOSIS — Z23 Encounter for immunization: Secondary | ICD-10-CM | POA: Diagnosis not present

## 2019-06-02 ENCOUNTER — Other Ambulatory Visit: Payer: Self-pay

## 2019-06-02 DIAGNOSIS — R6889 Other general symptoms and signs: Secondary | ICD-10-CM | POA: Diagnosis not present

## 2019-06-02 DIAGNOSIS — Z20822 Contact with and (suspected) exposure to covid-19: Secondary | ICD-10-CM

## 2019-06-04 LAB — NOVEL CORONAVIRUS, NAA: SARS-CoV-2, NAA: NOT DETECTED

## 2019-06-05 ENCOUNTER — Telehealth: Payer: Self-pay

## 2019-06-05 ENCOUNTER — Other Ambulatory Visit: Payer: Self-pay | Admitting: Medical

## 2019-06-05 MED ORDER — AMOXICILLIN 500 MG PO TABS: 500.0000 mg | ORAL_TABLET | Freq: Three times a day (TID) | ORAL | 0 refills | Status: DC

## 2019-06-05 NOTE — Telephone Encounter (Signed)
As a courtesy I sent amoxicillin to her pharmacy for suspected strep infection given the exposure.  Generally this would require a visit.. She needs to complete the antibiotic prescription 3 times a day x10 days.  By the way I was looking back in her chart and I cannot tell where she is had a preventative care physical visit in a long time.  Please set her up a physical visit/preventive care visit and fasting.  I do not see a mammogram ordered any recent lab screening in years which we would recommend.

## 2019-06-05 NOTE — Telephone Encounter (Signed)
Pt called and stated her daughter was diagnosed with strep Tuesday. Pt now has patches in her throat after eating behind her daughter. She wants to know if medication can be sent to the pharmacy for her. Please advise.

## 2019-06-08 NOTE — Telephone Encounter (Signed)
Patient was informed.

## 2019-07-06 DIAGNOSIS — Z01419 Encounter for gynecological examination (general) (routine) without abnormal findings: Secondary | ICD-10-CM | POA: Diagnosis not present

## 2019-07-06 DIAGNOSIS — Z6822 Body mass index (BMI) 22.0-22.9, adult: Secondary | ICD-10-CM | POA: Diagnosis not present

## 2019-07-06 DIAGNOSIS — Z1231 Encounter for screening mammogram for malignant neoplasm of breast: Secondary | ICD-10-CM | POA: Diagnosis not present

## 2019-07-06 DIAGNOSIS — Z1151 Encounter for screening for human papillomavirus (HPV): Secondary | ICD-10-CM | POA: Diagnosis not present

## 2019-07-06 LAB — HM MAMMOGRAPHY

## 2019-07-09 ENCOUNTER — Encounter: Payer: Self-pay | Admitting: *Deleted

## 2019-07-28 ENCOUNTER — Other Ambulatory Visit: Payer: Self-pay

## 2019-07-28 DIAGNOSIS — Z20822 Contact with and (suspected) exposure to covid-19: Secondary | ICD-10-CM

## 2019-07-28 DIAGNOSIS — R6889 Other general symptoms and signs: Secondary | ICD-10-CM | POA: Diagnosis not present

## 2019-07-30 LAB — NOVEL CORONAVIRUS, NAA: SARS-CoV-2, NAA: NOT DETECTED

## 2019-09-21 ENCOUNTER — Encounter: Payer: BLUE CROSS/BLUE SHIELD | Admitting: Family Medicine

## 2019-10-07 DIAGNOSIS — Z03818 Encounter for observation for suspected exposure to other biological agents ruled out: Secondary | ICD-10-CM | POA: Diagnosis not present

## 2019-10-08 DIAGNOSIS — J029 Acute pharyngitis, unspecified: Secondary | ICD-10-CM | POA: Diagnosis not present

## 2019-10-27 DIAGNOSIS — Z20828 Contact with and (suspected) exposure to other viral communicable diseases: Secondary | ICD-10-CM | POA: Diagnosis not present

## 2019-12-02 ENCOUNTER — Ambulatory Visit: Payer: BC Managed Care – PPO | Attending: Internal Medicine

## 2019-12-02 ENCOUNTER — Other Ambulatory Visit: Payer: BC Managed Care – PPO

## 2019-12-04 ENCOUNTER — Other Ambulatory Visit: Payer: BC Managed Care – PPO

## 2019-12-04 ENCOUNTER — Ambulatory Visit: Payer: BC Managed Care – PPO | Attending: Internal Medicine

## 2019-12-04 DIAGNOSIS — Z20822 Contact with and (suspected) exposure to covid-19: Secondary | ICD-10-CM

## 2019-12-05 LAB — NOVEL CORONAVIRUS, NAA: SARS-CoV-2, NAA: NOT DETECTED

## 2019-12-10 ENCOUNTER — Ambulatory Visit: Payer: BC Managed Care – PPO | Attending: Internal Medicine

## 2019-12-10 DIAGNOSIS — Z20822 Contact with and (suspected) exposure to covid-19: Secondary | ICD-10-CM

## 2019-12-11 LAB — NOVEL CORONAVIRUS, NAA: SARS-CoV-2, NAA: NOT DETECTED

## 2019-12-23 ENCOUNTER — Ambulatory Visit: Payer: BC Managed Care – PPO | Attending: Internal Medicine

## 2019-12-23 DIAGNOSIS — Z20822 Contact with and (suspected) exposure to covid-19: Secondary | ICD-10-CM | POA: Diagnosis not present

## 2019-12-25 LAB — NOVEL CORONAVIRUS, NAA: SARS-CoV-2, NAA: NOT DETECTED

## 2020-01-23 DIAGNOSIS — J020 Streptococcal pharyngitis: Secondary | ICD-10-CM | POA: Diagnosis not present

## 2020-01-23 DIAGNOSIS — J029 Acute pharyngitis, unspecified: Secondary | ICD-10-CM | POA: Diagnosis not present

## 2020-07-07 ENCOUNTER — Telehealth: Payer: BC Managed Care – PPO | Admitting: Family Medicine

## 2020-07-07 ENCOUNTER — Other Ambulatory Visit: Payer: Self-pay

## 2020-07-07 ENCOUNTER — Encounter: Payer: Self-pay | Admitting: Family Medicine

## 2020-07-07 VITALS — Ht 61.0 in | Wt 130.0 lb

## 2020-07-07 DIAGNOSIS — J020 Streptococcal pharyngitis: Secondary | ICD-10-CM

## 2020-07-07 MED ORDER — AMOXICILLIN 875 MG PO TABS: 875.0000 mg | ORAL_TABLET | Freq: Two times a day (BID) | ORAL | 0 refills | Status: DC

## 2020-07-07 NOTE — Patient Instructions (Signed)
Be sure to stay well hydrated. Continue tylenol as needed for pain. You can also try salt water gargles. Postnasal drainage can contribute to sore throat.  Since you have some sneezing and runny nose, that may be a part of your symptoms.  Take claritin or allegra or zyrtec for allergies.  Isolate for at least 1 day. If symptoms are worsening despite the allergy medication and antibiotic, go get a COVID test. If your symptoms persist or worsen, let us know.   Strep Throat, Adult Strep throat is an infection in the throat that is caused by bacteria. It is common during the cold months of the year. It mostly affects children who are 56-62 years old. However, people of all ages can get it at any time of the year. This infection spreads from person to person (is contagious) through coughing, sneezing, or having close contact. Your health care provider may use other names to describe the infection. It can be called tonsillitis (if there is swelling of the tonsils), or pharyngitis (if there is swelling at the back of the throat). What are the causes? This condition is caused by the Streptococcus pyogenes bacteria. What increases the risk? You are more likely to develop this condition if:  You care for school-age children, or are around school-age children. Children are more likely to get strep throat and may spread it to others.  You spend time in crowded places where the infection can spread easily.  You have close contact with someone who has strep throat. What are the signs or symptoms? Symptoms of this condition include:  Fever or chills.  Redness, swelling, or pain in the tonsils or throat.  Pain or difficulty when swallowing.  White or yellow spots on the tonsils or throat.  Tender glands in the neck and under the jaw.  Bad smelling breath.  Red rash all over the body. This is rare. How is this diagnosed? This condition is diagnosed by tests that check for the presence and the  amount of bacteria that cause strep throat. They are:  Rapid strep test. Your throat is swabbed and checked for the presence of bacteria. Results are usually ready in minutes.  Throat culture test. Your throat is swabbed. The sample is placed in a cup that allows infections to grow. Results are usually ready in 1 or 2 days. How is this treated? This condition may be treated with:  Medicines that kill germs (antibiotics).  Medicines that relieve pain or fever. These include: ? Ibuprofen or acetaminophen. ? Aspirin, only for patients who are over the age of 72. ? Throat lozenges. ? Throat sprays. Follow these instructions at home: Medicines   Take over-the-counter and prescription medicines only as told by your health care provider.  Take your antibiotic medicine as told by your health care provider. Do not stop taking the antibiotic even if you start to feel better. Eating and drinking   If you have trouble swallowing, try eating soft foods until your sore throat feels better.  Drink enough fluid to keep your urine pale yellow.  To help relieve pain, you may have: ? Warm fluids, such as soup and tea. ? Cold fluids, such as frozen desserts or popsicles. General instructions  Gargle with a salt-water mixture 3-4 times a day or as needed. To make a salt-water mixture, completely dissolve -1 tsp (3-6 g) of salt in 1 cup (237 mL) of warm water.  Get plenty of rest.  Stay home from work or school until  you have been taking antibiotics for 24 hours.  Avoid smoking or being around people who smoke.  Keep all follow-up visits as told by your health care provider. This is important. How is this prevented?   Do not share food, drinking cups, or personal items that could cause the infection to spread to other people.  Wash your hands well with soap and water, and make sure that all people in your house wash their hands well.  Have family members tested if they have a sore throat  or fever. They may need an antibiotic if they have strep throat. Contact a health care provider if:  The glands in your neck continue to get bigger.  You develop a rash, cough, or earache.  You cough up a thick mucus that is green, yellow-brown, or bloody.  You have pain or discomfort that does not get better with medicine.  Your symptoms seem to be getting worse and not better.  You have a fever. Get help right away if:  You have new symptoms, such as vomiting, severe headache, stiff or painful neck, chest pain, or shortness of breath.  You have severe throat pain, drooling, or changes in your voice.  You have swelling of the neck, or the skin on the neck becomes red and tender.  You have signs of dehydration, such as tiredness (fatigue), dry mouth, and decreased urination.  You become increasingly sleepy, or you cannot wake up completely.  Your joints become red or painful. Summary  Strep throat is an infection in the throat that is caused by the Streptococcus pyogenes bacteria. This infection is spread from person to person (is contagious) through coughing, sneezing, or having close contact.  Take your medicines, including antibiotics, as told by your health care provider. Do not stop taking the antibiotic even if you start to feel better.  To prevent the spread of germs, wash your hands well with soap and water. Have others do the same. Do not share food, drinking cups, or personal items.  Get help right away if you have new symptoms, such as vomiting, severe headache, stiff or painful neck, chest pain, or shortness of breath. This information is not intended to replace advice given to you by your health care provider. Make sure you discuss any questions you have with your health care provider. Document Revised: 01/09/2019 Document Reviewed: 01/09/2019 [employer]vier Patient Education  2020 [employer].

## 2020-07-07 NOTE — Progress Notes (Signed)
Start time: 12:44 End time:12: 55  Virtual Visit via Video Note  I connected with Savonna Birchmeier on 07/07/20 at 12:00 PM EDT by a video enabled telemedicine application and verified that I am speaking with the correct person using two identifiers.  Location: Patient: home Provider: office   I discussed the limitations of evaluation and management by telemedicine and the availability of in person appointments. The patient expressed understanding and agreed to proceed.  History of Present Illness:  Chief Complaint  Patient presents with  . Sore Throat    [facility] has ST, son tested postive yesterday for strep-negative for COVID. Her symptoms started last night. Wants to get it before it gets too bad. Sweating, has not checked temp due to thermometer being broken. Inner ears are itching. When Tylenol wears off, feels like "razor blades." Has a HA as well.    41 yo son was diagnosed with strep throat, had negative COVID test yesterday at his pediatrician's office. Patient started feeling bad late yesterday afternoon, getting worse.  Complaining of headache, tactile fever, sore throat. Ears feel like they're clogged, popping. Neck/glands feel swollen and sore.  Slight runny nose/sneezing. Denies cough, shortness of breath.    PMH, PSH, SH reviewed  Husband vasectomy   Outpatient Encounter Medications as of 07/07/2020  Medication Sig Note  . acetaminophen (TYLENOL) 500 MG tablet Take 1,000 mg by mouth every 6 (six) hours as needed. 07/07/2020: Last dose 8:30am  . [DISCONTINUED] amoxicillin (AMOXIL) 500 MG tablet Take 1 tablet (500 mg total) by mouth 3 (three) times daily.   . [DISCONTINUED] ibuprofen (ADVIL,MOTRIN) 200 MG tablet Take 600 mg by mouth every 6 (six) hours as needed. 10/16/2018: Last dose 12:30pm  . [DISCONTINUED] Pseudoephedrine-APAP-DM (DAYQUIL PO) Take 30 mLs by mouth as needed. 10/16/2018: Last dose 9am   No facility-administered encounter medications on file as of 07/07/2020.    No Known Allergies  ROS:  Tactile fever, headache, URI/throat symptoms per HPI.  No cough, shortness of breath, GI complaints or other concerns.   Observations/Objective:  Ht 5\' 1"  (1.549 m)   Wt 130 lb (59 kg)   LMP 06/13/2020 (Exact Date)   BMI 24.56 kg/m    Well-appearing, pleasant female in no distress. Normal voice, normal speech, swallowing comfortably. Indicates soreness at her neck anteriorly on both sides (no swelling visible). Exam is limited due to virtual nature of the visit.   Assessment and Plan:  Strep pharyngitis - given runny nose/sneezing cannot also r/o allergies vs early URI symptoms (including COVID). Supportive measures reviewed - Plan: amoxicillin (AMOXIL) 875 MG tablet  Be sure to stay well hydrated. Continue tylenol as needed for pain. You can also try salt water gargles. Postnasal drainage can contribute to sore throat.  Since you have some sneezing and runny nose, that may be a part of your symptoms.  Take claritin or allegra or zyrtec for allergies.  Isolate for at least 1 day. If symptoms are worsening despite the allergy medication and antibiotic, go get a COVID test. If your symptoms persist or worsen, let 08/13/2020 know.  Follow Up Instructions:    I discussed the assessment and treatment plan with the patient. The patient was provided an opportunity to ask questions and all were answered. The patient agreed with the plan and demonstrated an understanding of the instructions.   The patient was advised to call back or seek an in-person evaluation if the symptoms worsen or if the condition fails to improve as anticipated.  I provided  15 minutes of video face-to-face time during this encounter, additional time spent in chart review and documentation   Lavonda Jumbo, MD

## 2020-08-12 DIAGNOSIS — Z872 Personal history of diseases of the skin and subcutaneous tissue: Secondary | ICD-10-CM | POA: Diagnosis not present

## 2020-08-12 DIAGNOSIS — D485 Neoplasm of uncertain behavior of skin: Secondary | ICD-10-CM | POA: Diagnosis not present

## 2020-08-12 DIAGNOSIS — L218 Other seborrheic dermatitis: Secondary | ICD-10-CM | POA: Diagnosis not present

## 2020-08-12 DIAGNOSIS — D225 Melanocytic nevi of trunk: Secondary | ICD-10-CM | POA: Diagnosis not present

## 2020-08-12 DIAGNOSIS — D224 Melanocytic nevi of scalp and neck: Secondary | ICD-10-CM | POA: Diagnosis not present

## 2020-08-12 DIAGNOSIS — D2272 Melanocytic nevi of left lower limb, including hip: Secondary | ICD-10-CM | POA: Diagnosis not present

## 2020-08-12 DIAGNOSIS — D229 Melanocytic nevi, unspecified: Secondary | ICD-10-CM | POA: Diagnosis not present

## 2020-10-03 ENCOUNTER — Other Ambulatory Visit: Payer: Self-pay

## 2020-10-03 ENCOUNTER — Other Ambulatory Visit (INDEPENDENT_AMBULATORY_CARE_PROVIDER_SITE_OTHER): Payer: BC Managed Care – PPO

## 2020-10-03 ENCOUNTER — Telehealth: Payer: Self-pay

## 2020-10-03 DIAGNOSIS — Z1152 Encounter for screening for COVID-19: Secondary | ICD-10-CM | POA: Diagnosis not present

## 2020-10-03 LAB — POC COVID19 BINAXNOW: SARS Coronavirus 2 Ag: NEGATIVE

## 2020-10-03 NOTE — Telephone Encounter (Signed)
LVM for pt to call back for lab results KH 

## 2020-10-30 DIAGNOSIS — Z20822 Contact with and (suspected) exposure to covid-19: Secondary | ICD-10-CM | POA: Diagnosis not present

## 2020-10-30 DIAGNOSIS — R5383 Other fatigue: Secondary | ICD-10-CM | POA: Diagnosis not present

## 2020-10-30 DIAGNOSIS — Z03818 Encounter for observation for suspected exposure to other biological agents ruled out: Secondary | ICD-10-CM | POA: Diagnosis not present

## 2020-10-30 DIAGNOSIS — J029 Acute pharyngitis, unspecified: Secondary | ICD-10-CM | POA: Diagnosis not present

## 2020-10-31 DIAGNOSIS — Z20822 Contact with and (suspected) exposure to covid-19: Secondary | ICD-10-CM | POA: Diagnosis not present

## 2020-10-31 DIAGNOSIS — J029 Acute pharyngitis, unspecified: Secondary | ICD-10-CM | POA: Diagnosis not present

## 2020-11-30 DIAGNOSIS — Z1231 Encounter for screening mammogram for malignant neoplasm of breast: Secondary | ICD-10-CM | POA: Diagnosis not present

## 2020-11-30 LAB — HM MAMMOGRAPHY

## 2020-12-02 ENCOUNTER — Encounter: Payer: Self-pay | Admitting: Family Medicine

## 2021-05-01 DIAGNOSIS — D225 Melanocytic nevi of trunk: Secondary | ICD-10-CM | POA: Diagnosis not present

## 2021-05-01 DIAGNOSIS — L905 Scar conditions and fibrosis of skin: Secondary | ICD-10-CM | POA: Diagnosis not present

## 2021-05-01 DIAGNOSIS — Z872 Personal history of diseases of the skin and subcutaneous tissue: Secondary | ICD-10-CM | POA: Diagnosis not present

## 2021-05-02 DIAGNOSIS — F330 Major depressive disorder, recurrent, mild: Secondary | ICD-10-CM | POA: Diagnosis not present

## 2021-05-23 DIAGNOSIS — F330 Major depressive disorder, recurrent, mild: Secondary | ICD-10-CM | POA: Diagnosis not present

## 2021-06-16 DIAGNOSIS — L905 Scar conditions and fibrosis of skin: Secondary | ICD-10-CM | POA: Diagnosis not present

## 2021-06-16 DIAGNOSIS — D485 Neoplasm of uncertain behavior of skin: Secondary | ICD-10-CM | POA: Diagnosis not present

## 2021-08-09 DIAGNOSIS — F330 Major depressive disorder, recurrent, mild: Secondary | ICD-10-CM | POA: Diagnosis not present

## 2021-08-23 DIAGNOSIS — F330 Major depressive disorder, recurrent, mild: Secondary | ICD-10-CM | POA: Diagnosis not present

## 2021-09-07 DIAGNOSIS — Z23 Encounter for immunization: Secondary | ICD-10-CM | POA: Diagnosis not present

## 2021-09-13 DIAGNOSIS — F330 Major depressive disorder, recurrent, mild: Secondary | ICD-10-CM | POA: Diagnosis not present

## 2021-11-23 ENCOUNTER — Telehealth: Payer: Self-pay

## 2021-11-23 NOTE — Telephone Encounter (Signed)
Called pt to advise she needs an appointment for at least a med check. We also need to know who her ob/gyn doctor office is . [place]

## 2022-01-09 DIAGNOSIS — Z1231 Encounter for screening mammogram for malignant neoplasm of breast: Secondary | ICD-10-CM | POA: Diagnosis not present

## 2022-01-09 LAB — HM MAMMOGRAPHY

## 2022-01-11 DIAGNOSIS — R87612 Low grade squamous intraepithelial lesion on cytologic smear of cervix (LGSIL): Secondary | ICD-10-CM | POA: Diagnosis not present

## 2022-01-11 DIAGNOSIS — Z124 Encounter for screening for malignant neoplasm of cervix: Secondary | ICD-10-CM | POA: Diagnosis not present

## 2022-01-11 DIAGNOSIS — Z113 Encounter for screening for infections with a predominantly sexual mode of transmission: Secondary | ICD-10-CM | POA: Diagnosis not present

## 2022-01-11 DIAGNOSIS — Z01419 Encounter for gynecological examination (general) (routine) without abnormal findings: Secondary | ICD-10-CM | POA: Diagnosis not present

## 2022-01-11 DIAGNOSIS — Z6824 Body mass index (BMI) 24.0-24.9, adult: Secondary | ICD-10-CM | POA: Diagnosis not present

## 2022-01-11 LAB — RESULTS CONSOLE HPV: CHL HPV: NEGATIVE

## 2022-01-11 LAB — HM PAP SMEAR

## 2022-01-22 DIAGNOSIS — F330 Major depressive disorder, recurrent, mild: Secondary | ICD-10-CM | POA: Diagnosis not present

## 2022-01-26 ENCOUNTER — Encounter: Payer: Self-pay | Admitting: Family Medicine

## 2022-02-02 ENCOUNTER — Encounter: Payer: Self-pay | Admitting: Family Medicine

## 2022-02-20 ENCOUNTER — Encounter: Payer: Self-pay | Admitting: Family Medicine

## 2022-02-20 ENCOUNTER — Ambulatory Visit: Payer: BC Managed Care – PPO | Admitting: Family Medicine

## 2022-02-20 VITALS — BP 122/80 | HR 71 | Temp 98.8°F | Wt 130.6 lb

## 2022-02-20 DIAGNOSIS — G4700 Insomnia, unspecified: Secondary | ICD-10-CM

## 2022-02-20 DIAGNOSIS — Z6379 Other stressful life events affecting family and household: Secondary | ICD-10-CM

## 2022-02-20 MED ORDER — ZOLPIDEM TARTRATE 10 MG PO TABS: 10.0000 mg | ORAL_TABLET | Freq: Every evening | ORAL | 1 refills | Status: DC | PRN

## 2022-02-20 MED ORDER — ZOLPIDEM TARTRATE 5 MG PO TABS: 5.0000 mg | ORAL_TABLET | Freq: Every evening | ORAL | 1 refills | Status: DC | PRN

## 2022-02-20 NOTE — Patient Instructions (Signed)
Insomnia ?Insomnia is a sleep disorder that makes it difficult to fall asleep or stay asleep. Insomnia can cause fatigue, low energy, difficulty concentrating, mood swings, and poor performance at work or school. ?There are three different ways to classify insomnia: ?Difficulty falling asleep. ?Difficulty staying asleep. ?Waking up too early in the morning. ?Any type of insomnia can be long-term (chronic) or short-term (acute). Both are common. Short-term insomnia usually lasts for 3 months or less. Chronic insomnia occurs at least three times a week for longer than 3 months. ?What are the causes? ?Insomnia may be caused by another condition, situation, or substance, such as: ?Having certain mental health conditions, such as anxiety and depression. ?Using caffeine, alcohol, tobacco, or drugs. ?Having gastrointestinal conditions, such as gastroesophageal reflux disease (GERD). ?Having certain medical conditions. These include: ?Asthma. ?Alzheimer's disease. ?Stroke. ?Chronic pain. ?An overactive thyroid gland (hyperthyroidism). ?Other sleep disorders, such as restless legs syndrome and sleep apnea. ?Menopause. ?Sometimes, the cause of insomnia may not be known. ?What increases the risk? ?Risk factors for insomnia include: ?Gender. Females are affected more often than males. ?Age. Insomnia is more common as people get older. ?Stress and certain medical and mental health conditions. ?Lack of exercise. ?Having an irregular work schedule. This may include working night shifts and traveling between different time zones. ?What are the signs or symptoms? ?If you have insomnia, the main symptom is having trouble falling asleep or having trouble staying asleep. This may lead to other symptoms, such as: ?Feeling tired or having low energy. ?Feeling nervous about going to sleep. ?Not feeling rested in the morning. ?Having trouble concentrating. ?Feeling irritable, anxious, or depressed. ?How is this diagnosed? ?This condition  may be diagnosed based on: ?Your symptoms and medical history. Your health care provider may ask about: ?Your sleep habits. ?Any medical conditions you have. ?Your mental health. ?A physical exam. ?How is this treated? ?Treatment for insomnia depends on the cause. Treatment may focus on treating an underlying condition that is causing the insomnia. Treatment may also include: ?Medicines to help you sleep. ?Counseling or therapy. ?Lifestyle adjustments to help you sleep better. ?Follow these instructions at home: ?Eating and drinking ? ?Limit or avoid alcohol, caffeinated beverages, and products that contain nicotine and tobacco, especially close to bedtime. These can disrupt your sleep. ?Do not eat a large meal or eat spicy foods right before bedtime. This can lead to digestive discomfort that can make it hard for you to sleep. ?Sleep habits ? ?Keep a sleep diary to help you and your health care provider figure out what could be causing your insomnia. Write down: ?When you sleep. ?When you wake up during the night. ?How well you sleep and how rested you feel the next day. ?Any side effects of medicines you are taking. ?What you eat and drink. ?Make your bedroom a dark, comfortable place where it is easy to fall asleep. ?Put up shades or blackout curtains to block light from outside. ?Use a white noise machine to block noise. ?Keep the temperature cool. ?Limit screen use before bedtime. This includes: ?Not watching TV. ?Not using your smartphone, tablet, or computer. ?Stick to a routine that includes going to bed and waking up at the same times every day and night. This can help you fall asleep faster. Consider making a quiet activity, such as reading, part of your nighttime routine. ?Try to avoid taking naps during the day so that you sleep better at night. ?Get out of bed if you are still awake after  15 minutes of trying to sleep. Keep the lights down, but try reading or doing a quiet activity. When you feel  sleepy, go back to bed. ?General instructions ?Take over-the-counter and prescription medicines only as told by your health care provider. ?Exercise regularly as told by your health care provider. However, avoid exercising in the hours right before bedtime. ?Use relaxation techniques to manage stress. Ask your health care provider to suggest some techniques that may work well for you. These may include: ?Breathing exercises. ?Routines to release muscle tension. ?Visualizing peaceful scenes. ?Make sure that you drive carefully. Do not drive if you feel very sleepy. ?Keep all follow-up visits. This is important. ?Contact a health care provider if: ?You are tired throughout the day. ?You have trouble in your daily routine due to sleepiness. ?You continue to have sleep problems, or your sleep problems get worse. ?Get help right away if: ?You have thoughts about hurting yourself or someone else. ?Get help right away if you feel like you may hurt yourself or others, or have thoughts about taking your own life. Go to your nearest emergency room or: ?Call 911. ?Call the [facility] at [phone] or 988. This is open 24 hours a day. ?Text the Crisis Text Line at 480-041-5033. ?Summary ?Insomnia is a sleep disorder that makes it difficult to fall asleep or stay asleep. ?Insomnia can be long-term (chronic) or short-term (acute). ?Treatment for insomnia depends on the cause. Treatment may focus on treating an underlying condition that is causing the insomnia. ?Keep a sleep diary to help you and your health care provider figure out what could be causing your insomnia. ?This information is not intended to replace advice given to you by your health care provider. Make sure you discuss any questions you have with your health care provider. ?Taking Ambien for 3 or 4 days to come to get into the cycle of sleeping and then after that use it as needed if you are not asleep after half hour to 45 minutes to take  something ?Document Revised: 10/02/2021 Document Reviewed: 10/02/2021 ?Elsevier Patient Education ? [place]. ? ?

## 2022-02-20 NOTE — Progress Notes (Addendum)
? ?  Subjective:  ? ? Patient ID: Katherine Bowman, female    DOB: May 16, 1981, 41 y.o.   MRN: [id number] ? ?HPI ?She is here for consult concerning continued difficulty with intermittent insomnia.  She has been under a lot of difficulty recently dealing with her mother who has terminal cancer.  She has tried OTC meds without success to help with this sleep.  She does not drink coffee, does not use a decongestant.  Drinks maybe 1 drink per week.  She is involved in counseling to help learn how to deal with her mother who has become quite a burden especially dealing with anger directed towards the daughter.  There was remote history of aches and pains however it was not truly fibromyalgia. ? ? ?Review of Systems ? ?   ?Objective:  ? Physical Exam ?Alert and in no distress with appropriate affect. ? ? ? ?   ?Assessment & Plan:  ?Insomnia, unspecified type - Plan: zolpidem (AMBIEN) 10 MG tablet ? ?Stress due to illness of family member ?I encouraged her to continue in counseling which she plans to do.  Discussed various stress reducing techniques with her.  Also gave information concerning sleep and sleep hygiene.  Ambien given.  Instructed her to use it regularly for 3 to 4 days to try and break the cycle and then on an as-needed basis after that.  Encouraged her to discuss sleep disturbance with her therapist to have the therapist get some positive guidance toward this.  I did not schedule her for repeat appointment did recommend that she come back on an as-needed basis.  She was comfortable with that.  Over 30 minutes spent discussing these issues with her. ?Insurance would not cover the channel milligram dosing so I had to write for 5 mg. ?

## 2022-02-20 NOTE — Addendum Note (Signed)
Addended by: Ronnald Nian on: 02/20/2022 04:17 PM ? ? Modules accepted: Orders ? ?

## 2022-05-23 DIAGNOSIS — F330 Major depressive disorder, recurrent, mild: Secondary | ICD-10-CM | POA: Diagnosis not present

## 2022-07-11 ENCOUNTER — Encounter: Payer: Self-pay | Admitting: Internal Medicine

## 2022-08-01 ENCOUNTER — Encounter: Payer: BC Managed Care – PPO | Admitting: Family Medicine

## 2022-08-27 ENCOUNTER — Encounter: Payer: Self-pay | Admitting: Internal Medicine

## 2022-08-27 DIAGNOSIS — L814 Other melanin hyperpigmentation: Secondary | ICD-10-CM | POA: Diagnosis not present

## 2022-08-27 DIAGNOSIS — L218 Other seborrheic dermatitis: Secondary | ICD-10-CM | POA: Diagnosis not present

## 2022-08-27 DIAGNOSIS — D225 Melanocytic nevi of trunk: Secondary | ICD-10-CM | POA: Diagnosis not present

## 2022-08-27 DIAGNOSIS — L821 Other seborrheic keratosis: Secondary | ICD-10-CM | POA: Diagnosis not present

## 2023-01-08 ENCOUNTER — Ambulatory Visit: Payer: BC Managed Care – PPO | Admitting: Family Medicine

## 2023-01-08 ENCOUNTER — Encounter: Payer: Self-pay | Admitting: Family Medicine

## 2023-01-08 VITALS — BP 112/70 | HR 60 | Ht 61.5 in | Wt 134.2 lb

## 2023-01-08 DIAGNOSIS — Z23 Encounter for immunization: Secondary | ICD-10-CM

## 2023-01-08 DIAGNOSIS — G479 Sleep disorder, unspecified: Secondary | ICD-10-CM

## 2023-01-08 DIAGNOSIS — Z0000 Encounter for general adult medical examination without abnormal findings: Secondary | ICD-10-CM

## 2023-01-08 DIAGNOSIS — Z1159 Encounter for screening for other viral diseases: Secondary | ICD-10-CM

## 2023-01-08 LAB — POCT URINALYSIS DIP (PROADVANTAGE DEVICE)
Bilirubin, UA: NEGATIVE
Glucose, UA: NEGATIVE mg/dL
Ketones, POC UA: NEGATIVE mg/dL
Nitrite, UA: NEGATIVE
Protein Ur, POC: NEGATIVE mg/dL
Specific Gravity, Urine: 1.02
Urobilinogen, Ur: 0.2
pH, UA: 6 (ref 5.0–8.0)

## 2023-01-08 LAB — CBC WITH DIFFERENTIAL/PLATELET
Basos: 1 %
Eos: 5 %
Hematocrit: 40.1 % (ref 34.0–46.6)
Hemoglobin: 13.2 g/dL (ref 11.1–15.9)
Immature Grans (Abs): 0 10*3/uL (ref 0.0–0.1)
Lymphocytes Absolute: 1.5 10*3/uL (ref 0.7–3.1)
MCH: 30.6 pg (ref 26.6–33.0)
MCV: 93 fL (ref 79–97)
Neutrophils Absolute: 3.1 10*3/uL (ref 1.4–7.0)
Platelets: 268 10*3/uL (ref 150–450)
RBC: 4.31 x10E6/uL (ref 3.77–5.28)
WBC: 5.4 10*3/uL (ref 3.4–10.8)

## 2023-01-08 LAB — LIPID PANEL
Cholesterol, Total: 200 mg/dL — ABNORMAL HIGH (ref 100–199)
HDL: 87 mg/dL (ref >39)
LDL Chol Calc (NIH): 99 mg/dL (ref 0–99)
Triglycerides: 78 mg/dL (ref 0–149)

## 2023-01-08 LAB — COMPREHENSIVE METABOLIC PANEL
ALT: 14 IU/L (ref 0–32)
AST: 16 IU/L (ref 0–40)
Albumin/Globulin Ratio: 1.8 (ref 1.2–2.2)
BUN: 23 mg/dL (ref 6–24)
CO2: 22 mmol/L (ref 20–29)
Calcium: 9.3 mg/dL (ref 8.7–10.2)
Globulin, Total: 2.5 g/dL (ref 1.5–4.5)
Potassium: 4.8 mmol/L (ref 3.5–5.2)
Sodium: 138 mmol/L (ref 134–144)

## 2023-01-08 MED ORDER — ZOLPIDEM TARTRATE ER 6.25 MG PO TBCR: 6.2500 mg | EXTENDED_RELEASE_TABLET | Freq: Every evening | ORAL | 0 refills | Status: DC | PRN

## 2023-01-08 NOTE — Progress Notes (Signed)
Complete physical exam  Patient: Katherine Bowman   DOB: 04-07-1981   42 y.o. Female  MRN: [id number]  Subjective:    Chief Complaint  Patient presents with   Annual Exam    Fasting annual exam, sees GYN for paps. Is having some issues staying asleep. Took ambien the past, helped.     Katherine Bowman is a 42 y.o. female who presents today for a complete physical exam. She reports consuming a general diet. Gym/ health club routine includes cardio, high impact aerobics , and light weights. She generally feels well. She reports sleeping poorly.  In the past she had used Ambien but states it helped her fall asleep but not stay asleep.  She notes difficulty around the time of her menses with fatigue and sleep issues.  She works as a Scientist, product/process development.  Home life is going well with her marriage and with 2 children.  Apparently her mother died of cardiac and lung cancer related issues.  Otherwise her family history is noncontributory.  She sees her gynecologist regularly.  Most recent fall risk assessment:    01/08/2023    8:37 AM  [place] in the past year? 0  Number falls in past yr: 0  Injury with Fall? 0  Risk for fall due to : No Fall Risks  Follow up Falls evaluation completed     Most recent depression screenings:    01/08/2023    8:37 AM 02/20/2022   11:45 AM  PHQ 2/9 Scores  PHQ - 2 Score 0 0    Vision:Not within last year     Patient Care Team: Denita Lung, MD as PCP - General (Family Medicine)   Outpatient Medications Prior to Visit  Medication Sig   [DISCONTINUED] acetaminophen (TYLENOL) 500 MG tablet Take 1,000 mg by mouth every 6 (six) hours as needed.   [DISCONTINUED] amoxicillin (AMOXIL) 875 MG tablet Take 1 tablet (875 mg total) by mouth 2 (two) times daily. (Patient not taking: Reported on 02/20/2022)   [DISCONTINUED] zolpidem (AMBIEN) 5 MG tablet Take 1 tablet (5 mg total) by mouth at bedtime as needed for sleep. (Patient not taking: Reported on 01/08/2023)    No facility-administered medications prior to visit.    Review of Systems  All other systems reviewed and are negative.         Objective:     BP 112/70   Pulse 60   Ht 5' 1.5" (1.562 m)   Wt 134 lb 3.2 oz (60.9 kg)   LMP 01/01/2023 (Exact Date)   SpO2 98%   BMI 24.95 kg/m    Physical Exam  Alert and in no distress. Tympanic membranes and canals are normal. Pharyngeal area is normal. Neck is supple without adenopathy or thyromegaly. Cardiac exam shows a regular sinus rhythm without murmurs or gallops. Lungs are clear to auscultation.      Assessment & Plan:    Routine general medical examination at a health care facility - Plan: CBC with Differential/Platelet, Comprehensive metabolic panel, Lipid panel, POCT Urinalysis DIP (Proadvantage Device)  Need for Tdap vaccination - Plan: Tdap vaccine greater than or equal to 7yo IM  Need for hepatitis C screening test - Plan: Hepatitis C antibody  Sleep disturbance - Plan: zolpidem (AMBIEN CR) 6.25 MG CR tablet   Immunization History  Administered Date(s) Administered   PFIZER(Purple Top)SARS-COV-2 Vaccination 01/09/2020, 01/30/2020, 09/17/2020   Pfizer Covid-19 Vaccine Bivalent Booster 44yr & up 09/07/2021   Tdap 03/03/2012, 01/08/2023  Health Maintenance  Topic Date Due   Hepatitis C Screening  Never done   COVID-19 Vaccine (5 - 2023-24 season) 01/24/2023 (Originally 07/06/2022)   INFLUENZA VACCINE  02/03/2023 (Originally 06/05/2022)   HIV Screening  02/21/2023 (Originally 01/23/1994)   PAP SMEAR-Modifier  01/12/2027   DTaP/Tdap/Td (3 - Td or Tdap) 01/07/2033   HPV VACCINES  Aged Out    Discussed health benefits of physical activity, and encouraged her to engage in regular exercise appropriate for her age and condition.  Problem List Items Addressed This Visit   None Visit Diagnoses     Routine general medical examination at a health care facility    -  Primary   Relevant Orders   CBC with  Differential/Platelet   Comprehensive metabolic panel   Lipid panel   POCT Urinalysis DIP (Proadvantage Device) (Completed)   Need for Tdap vaccination       Relevant Orders   Tdap vaccine greater than or equal to 7yo IM (Completed)   Need for hepatitis C screening test       Relevant Orders   Hepatitis C antibody   Sleep disturbance       Relevant Medications   zolpidem (AMBIEN CR) 6.25 MG CR tablet      Discussed the use of Ambien on an as-needed basis.  She will keep me informed as to whether this helps with getting good night sleep.  Discussed potential overuse of this medication and she seems to have a good handle on this.  Encouraged her to continue to take good care of yourself.  Recheck here 1 year.    Jill Alexanders, MD

## 2023-01-09 LAB — LIPID PANEL
Chol/HDL Ratio: 2.3 ratio (ref 0.0–4.4)
VLDL Cholesterol Cal: 14 mg/dL (ref 5–40)

## 2023-01-09 LAB — CBC WITH DIFFERENTIAL/PLATELET
Basophils Absolute: 0.1 10*3/uL (ref 0.0–0.2)
EOS (ABSOLUTE): 0.3 10*3/uL (ref 0.0–0.4)
Immature Granulocytes: 0 %
Lymphs: 28 %
MCHC: 32.9 g/dL (ref 31.5–35.7)
Monocytes Absolute: 0.5 10*3/uL (ref 0.1–0.9)
Monocytes: 9 %
Neutrophils: 57 %
RDW: 12.2 % (ref 11.7–15.4)

## 2023-01-09 LAB — HEPATITIS C ANTIBODY: Hep C Virus Ab: NONREACTIVE

## 2023-01-09 LAB — COMPREHENSIVE METABOLIC PANEL
Albumin: 4.4 g/dL (ref 3.9–4.9)
Alkaline Phosphatase: 61 IU/L (ref 44–121)
BUN/Creatinine Ratio: 25 — ABNORMAL HIGH (ref 9–23)
Bilirubin Total: 0.2 mg/dL (ref 0.0–1.2)
Chloride: 102 mmol/L (ref 96–106)
Creatinine, Ser: 0.92 mg/dL (ref 0.57–1.00)
Glucose: 91 mg/dL (ref 70–99)
Total Protein: 6.9 g/dL (ref 6.0–8.5)
eGFR: 79 mL/min/{1.73_m2} (ref >59)

## 2023-01-15 DIAGNOSIS — Z1231 Encounter for screening mammogram for malignant neoplasm of breast: Secondary | ICD-10-CM | POA: Diagnosis not present

## 2023-01-15 LAB — HM MAMMOGRAPHY

## 2023-02-01 DIAGNOSIS — J029 Acute pharyngitis, unspecified: Secondary | ICD-10-CM | POA: Diagnosis not present

## 2023-02-19 DIAGNOSIS — D225 Melanocytic nevi of trunk: Secondary | ICD-10-CM | POA: Diagnosis not present

## 2023-02-19 DIAGNOSIS — L814 Other melanin hyperpigmentation: Secondary | ICD-10-CM | POA: Diagnosis not present

## 2023-02-19 DIAGNOSIS — D485 Neoplasm of uncertain behavior of skin: Secondary | ICD-10-CM | POA: Diagnosis not present

## 2023-02-19 DIAGNOSIS — L578 Other skin changes due to chronic exposure to nonionizing radiation: Secondary | ICD-10-CM | POA: Diagnosis not present

## 2023-02-19 DIAGNOSIS — L821 Other seborrheic keratosis: Secondary | ICD-10-CM | POA: Diagnosis not present

## 2023-02-19 DIAGNOSIS — D1801 Hemangioma of skin and subcutaneous tissue: Secondary | ICD-10-CM | POA: Diagnosis not present

## 2023-02-19 DIAGNOSIS — D2262 Melanocytic nevi of left upper limb, including shoulder: Secondary | ICD-10-CM | POA: Diagnosis not present

## 2023-02-27 DIAGNOSIS — D239 Other benign neoplasm of skin, unspecified: Secondary | ICD-10-CM | POA: Diagnosis not present

## 2023-02-27 DIAGNOSIS — D2262 Melanocytic nevi of left upper limb, including shoulder: Secondary | ICD-10-CM | POA: Diagnosis not present

## 2023-03-27 DIAGNOSIS — F330 Major depressive disorder, recurrent, mild: Secondary | ICD-10-CM | POA: Diagnosis not present

## 2023-04-12 ENCOUNTER — Other Ambulatory Visit: Payer: Self-pay | Admitting: Family Medicine

## 2023-04-12 DIAGNOSIS — G479 Sleep disorder, unspecified: Secondary | ICD-10-CM

## 2023-08-14 DIAGNOSIS — L219 Seborrheic dermatitis, unspecified: Secondary | ICD-10-CM | POA: Diagnosis not present

## 2023-08-30 DIAGNOSIS — D2261 Melanocytic nevi of right upper limb, including shoulder: Secondary | ICD-10-CM | POA: Diagnosis not present

## 2023-08-30 DIAGNOSIS — L814 Other melanin hyperpigmentation: Secondary | ICD-10-CM | POA: Diagnosis not present

## 2023-08-30 DIAGNOSIS — L578 Other skin changes due to chronic exposure to nonionizing radiation: Secondary | ICD-10-CM | POA: Diagnosis not present

## 2023-08-30 DIAGNOSIS — D225 Melanocytic nevi of trunk: Secondary | ICD-10-CM | POA: Diagnosis not present

## 2023-08-30 DIAGNOSIS — L821 Other seborrheic keratosis: Secondary | ICD-10-CM | POA: Diagnosis not present

## 2023-08-30 DIAGNOSIS — D1801 Hemangioma of skin and subcutaneous tissue: Secondary | ICD-10-CM | POA: Diagnosis not present

## 2023-08-30 DIAGNOSIS — D485 Neoplasm of uncertain behavior of skin: Secondary | ICD-10-CM | POA: Diagnosis not present

## 2023-11-10 DIAGNOSIS — J069 Acute upper respiratory infection, unspecified: Secondary | ICD-10-CM | POA: Diagnosis not present

## 2023-11-10 DIAGNOSIS — J029 Acute pharyngitis, unspecified: Secondary | ICD-10-CM | POA: Diagnosis not present

## 2023-11-15 DIAGNOSIS — F321 Major depressive disorder, single episode, moderate: Secondary | ICD-10-CM | POA: Diagnosis not present

## 2023-11-15 DIAGNOSIS — F410 Panic disorder [episodic paroxysmal anxiety] without agoraphobia: Secondary | ICD-10-CM | POA: Diagnosis not present

## 2023-12-30 DIAGNOSIS — F410 Panic disorder [episodic paroxysmal anxiety] without agoraphobia: Secondary | ICD-10-CM | POA: Diagnosis not present

## 2023-12-30 DIAGNOSIS — F4321 Adjustment disorder with depressed mood: Secondary | ICD-10-CM | POA: Diagnosis not present

## 2023-12-31 ENCOUNTER — Encounter: Payer: Self-pay | Admitting: Internal Medicine

## 2024-01-21 DIAGNOSIS — Z01419 Encounter for gynecological examination (general) (routine) without abnormal findings: Secondary | ICD-10-CM | POA: Diagnosis not present

## 2024-01-21 DIAGNOSIS — Z124 Encounter for screening for malignant neoplasm of cervix: Secondary | ICD-10-CM | POA: Diagnosis not present

## 2024-01-21 DIAGNOSIS — Z1231 Encounter for screening mammogram for malignant neoplasm of breast: Secondary | ICD-10-CM | POA: Diagnosis not present

## 2024-01-21 DIAGNOSIS — N959 Unspecified menopausal and perimenopausal disorder: Secondary | ICD-10-CM | POA: Diagnosis not present

## 2024-01-21 DIAGNOSIS — Z1331 Encounter for screening for depression: Secondary | ICD-10-CM | POA: Diagnosis not present

## 2024-01-21 LAB — HM MAMMOGRAPHY

## 2024-01-22 ENCOUNTER — Encounter: Payer: Self-pay | Admitting: Obstetrics and Gynecology

## 2024-03-31 DIAGNOSIS — F410 Panic disorder [episodic paroxysmal anxiety] without agoraphobia: Secondary | ICD-10-CM | POA: Diagnosis not present

## 2024-03-31 DIAGNOSIS — F321 Major depressive disorder, single episode, moderate: Secondary | ICD-10-CM | POA: Diagnosis not present

## 2024-10-12 ENCOUNTER — Telehealth: Payer: Self-pay

## 2024-10-12 NOTE — Telephone Encounter (Unsigned)
 Copied from CRM [id number]. Topic: Clinical - Medical Advice >> Oct 12, 2024 12:22 PM Aleatha C wrote: Reason for CRM: Both of patient kids have flu type B and was told to get a  prescription sent in for Tamaflu, patient would like a call back once it is filled or if there is any issue with filling it please call

## 2024-10-13 ENCOUNTER — Telehealth: Payer: Self-pay

## 2024-10-13 MED ORDER — OSELTAMIVIR PHOSPHATE 75 MG PO CAPS: 75.0000 mg | ORAL_CAPSULE | Freq: Every day | ORAL | 0 refills | Status: AC

## 2024-10-13 NOTE — Addendum Note (Signed)
 Addended by: JOYCE NORLEEN BROCKS on: 10/13/2024 02:57 PM   Modules accepted: Orders

## 2024-10-13 NOTE — Telephone Encounter (Signed)
 Copied from CRM [id number]. Topic: Clinical - Medical Advice >> Oct 12, 2024 12:22 PM Aleatha C wrote: Reason for CRM: Both of patient kids have flu type B and was told to get a  prescription sent in for Tamaflu, patient would like a call back once it is filled or if there is any issue with filling it please call >> Oct 13, 2024 12:50 PM Fonda T wrote: Pt is returning call from yesterday message taken, and states she has not received a call back from office.  Requesting a follow up call back as soon as possible, as it relates to having medication sent to pharmacy for her as both her children has Type B flu, requesting medication, Tamiflu .  CB# [phone]  Pt aware of same day call back.   Preferred Pharmacy: [facility] on [address] [place], [place]

## 2024-10-15 DIAGNOSIS — F5101 Primary insomnia: Secondary | ICD-10-CM | POA: Diagnosis not present

## 2024-10-15 DIAGNOSIS — Z789 Other specified health status: Secondary | ICD-10-CM | POA: Diagnosis not present

## 2024-10-15 DIAGNOSIS — J101 Influenza due to other identified influenza virus with other respiratory manifestations: Secondary | ICD-10-CM | POA: Diagnosis not present

## 2024-10-15 DIAGNOSIS — F410 Panic disorder [episodic paroxysmal anxiety] without agoraphobia: Secondary | ICD-10-CM | POA: Diagnosis not present

## 2024-10-15 DIAGNOSIS — Z6823 Body mass index (BMI) 23.0-23.9, adult: Secondary | ICD-10-CM | POA: Diagnosis not present
